# Patient Record
Sex: Female | Born: 1995 | Race: White | Hispanic: No | Marital: Single | Smoking: Former smoker
Health system: Southern US, Community
[De-identification: ages and names within clinical notes are randomized; demographics above are authoritative.]

## PROBLEM LIST (undated history)

## (undated) DIAGNOSIS — O24419 Gestational diabetes mellitus in pregnancy, unspecified control: Secondary | ICD-10-CM

## (undated) DIAGNOSIS — N83201 Unspecified ovarian cyst, right side: Secondary | ICD-10-CM

## (undated) DIAGNOSIS — F32A Depression, unspecified: Secondary | ICD-10-CM

## (undated) DIAGNOSIS — N83202 Unspecified ovarian cyst, left side: Secondary | ICD-10-CM

## (undated) DIAGNOSIS — F419 Anxiety disorder, unspecified: Secondary | ICD-10-CM

## (undated) DIAGNOSIS — F329 Major depressive disorder, single episode, unspecified: Secondary | ICD-10-CM

## (undated) HISTORY — PX: COMBINED HYSTEROSCOPY DIAGNOSTIC / D&C: SUR297

## (undated) HISTORY — DX: Anxiety disorder, unspecified: F41.9

## (undated) HISTORY — DX: Gestational diabetes mellitus in pregnancy, unspecified control: O24.419

## (undated) HISTORY — DX: Depression, unspecified: F32.A

## (undated) HISTORY — DX: Unspecified ovarian cyst, left side: N83.202

## (undated) HISTORY — PX: DILATION AND CURETTAGE OF UTERUS: SHX78

## (undated) HISTORY — DX: Unspecified ovarian cyst, right side: N83.201

## (undated) HISTORY — PX: APPENDECTOMY: SHX54

## (undated) HISTORY — DX: Major depressive disorder, single episode, unspecified: F32.9

---
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
## 2014-08-05 ENCOUNTER — Emergency Department: Payer: Self-pay | Admitting: Emergency Medicine

## 2014-11-11 ENCOUNTER — Ambulatory Visit
Admit: 2014-11-11 | Disposition: A | Discharge status: Home / Self Care | Payer: Self-pay | Attending: Obstetrics and Gynecology | Admitting: Obstetrics and Gynecology

## 2014-11-15 NOTE — H&P (Signed)
PATIENT NAME:  Holly Cain, Holly Cain MR#:  [id number] DATE OF BIRTH:  07/17/1996  DATE OF ADMISSION:  11/02/2013  REFERRING PHYSICIAN:  Dr. Sharyn Creamer.   PRIMARY CARE PHYSICIAN:  [facility].   CHIEF COMPLAINT:  Shortness of breath, cough, atypical chest pain.   HISTORY OF PRESENT ILLNESS:  This is an 19 year old female with significant past medical history of polycystic ovarian disease, presents with complaints of cough, shortness of breath and productive sputum, as well developed recently atypical chest pain related to cough, the patient reports these symptoms have been going on for almost a week right now, which prompted her to come to the ED, the patient was found to be tachycardic with low grade temp at 99, significant leukocytosis, the patient's d-dimer was mildly elevated, so she had CT chest angiogram which was negative for PE, but it did show diffuse infiltrative lung disease, the patient received one liter fluid boluses, received by mouth levofloxacin, but despite that remained tachycardic so hospitalists were requested to admit the patient, case was discussed with pulmonary on call by ED physician, the patient denies any hemoptysis, any recent travel, any leg swelling or edema.   PAST MEDICAL HISTORY:  Polycystic ovarian disease with cyst rupture in the past.   PAST SURGICAL HISTORY:  Appendectomy.   SOCIAL HISTORY:  The patient smokes cigarettes, 1 pack lasts her for four days, as well smokes vapor electronic cigarettes.  No alcohol.  No illicit drug use.   FAMILY HISTORY:  There is no family history of coronary artery disease at a young age.  No history of DVT or PE in the family.   ALLERGIES:  No known drug allergies.   HOME MEDICATIONS:  None.   REVIEW OF SYSTEMS: CONSTITUTIONAL:  The patient denies any fever, any chills.  Complains of fatigue and weakness.  Denies weight gain, weight loss.  EYES:  Denies blurred vision, double vision, inflammation, glaucoma.  EARS,  NOSE, THROAT:  Denies tinnitus, ear pain, hearing loss, epistaxis or discharge.  RESPIRATORY:  Complains of cough, productive sputum.  Denies any hemoptysis, any COPD.  CARDIOVASCULAR:  Reports atypical chest pain related to cough.  No arrhythmia.  No palpitation.  No syncope.  GASTROINTESTINAL:  Denies nausea, vomiting, diarrhea, abdominal pain, hematemesis.  GENITOURINARY:  Denies dysuria, hematuria, renal colic.  ENDOCRINE:  Denies polyuria, polydipsia, heat or cold intolerance.  HEMATOLOGY:  Denies anemia, easy bruising, bleeding diathesis.  INTEGUMENT:  Denies acne, rash or skin lesions.  MUSCULOSKELETAL:  Denies any arthritis, gout, back pain or redness.  NEUROLOGIC:  Denies CVA, TIA, ataxia, vertigo, tremors.  PSYCHIATRIC:  Denies anxiety, insomnia or bipolar disorder.   PHYSICAL EXAMINATION: VITAL SIGNS:  Temperature 98.8, T-max 99, pulse 117, respiratory rate 24, blood pressure 128/78, saturating 97% on room air.  GENERAL:  Well-nourished female who looks comfortable in bed, in no apparent distress.  HEENT:  Head atraumatic, normocephalic.  Pupils equal, reactive to light.  Pink conjunctivae.  Anicteric sclerae.  Dry oral mucosa.  NECK:  Supple.  No thyromegaly.  No JVD.  CHEST:  Good air entry bilaterally.  No wheezing, rales, rhonchi.  CARDIOVASCULAR:  S1, S2 heard.  No rubs, murmurs or gallops.  ABDOMEN:  Soft, nontender, nondistended.  Bowel sounds present.  EXTREMITIES:  No edema.  No clubbing.  No cyanosis.  Pedal and radial pulses +2 bilaterally.  PSYCHIATRIC:  Appropriate affect.  Awake, alert x 3.  Intact judgment and insight.  NEUROLOGIC:  Cranial nerves grossly intact.  Motor five out  of five.  No focal deficits.  MUSCULOSKELETAL:  No joint effusion or erythema.   PERTINENT LABORATORY DATA:  Glucose 86, BUN 7, creatinine 0.82, sodium 135, potassium 3.7, chloride 103, CO2 less than 0.02.  White blood cells 15.8, hemoglobin 13.6, hematocrit 42.5, platelets 257.  D-dimer  1792.   IMAGING STUDIES:  CT chest angiogram, no evidence of PE, showing diffuse infiltrative abnormality throughout both lungs.   ASSESSMENT AND PLAN: 1.  Sepsis, the patient meets sepsis criteria as she is tachycardic and has leukocytosis, this is most likely related to her pneumonia, will be started on broad-spectrum antibiotic.  2.  Pneumonia.  The patient appears to be having atypical pneumonia, will be started on Rocephin and Zithromax, we will consult pulmonary regarding her CT findings.  3.  Chest pain, atypical, noncardiac, related to cough, has no EKG changes.  We will cycle cardiac enzymes.  4.  Deep vein thrombosis prophylaxis.  SubQ heparin.  5.  CODE STATUS:  FULL CODE.  Total time spent on admission and patient care 50 minutes.     ____________________________ Starleen Armsawood S. Beren Yniguez, MD dse:ea D: 11/02/2013 00:56:50 ET T: 11/02/2013 02:33:42 ET JOB#: [id number]  cc: Starleen Armsawood S. Kayce Betty, MD, <Dictator> Buel Molder Teena IraniS Jossette Zirbel MD ELECTRONICALLY SIGNED 11/03/2013 2:46

## 2014-11-15 NOTE — Discharge Summary (Signed)
PATIENT NAME:  Holly DoeMCKINNEY, Holly M MR#:  [id number] DATE OF BIRTH:  [birth year]  DATE OF ADMISSION:  11/02/2013 DATE OF DISCHARGE:  11/05/2013  ADMITTING DIAGNOSIS: Shortness of breath, cough, chest pain.   DISCHARGE DIAGNOSES:  1.  Shortness of breath due to sepsis due to likely atypical pneumonia.  2.  Bilateral pulmonary abnormalities noted on CT scan. Differential diagnosis is broad including possible nonspecific interstitial pneumonia, idiopathic interstitial pneumonia,  interstitial pneumonia is also a consideration. Other possibilities include sarcoidosis.  3.  Tachycardia due to acute pulmonary infection, now improved.  4.  Leukocytosis due to pneumonia, now improved.  5.  Chest pain, pleuritic in nature, due to acute pulmonary process as described above.  6.  Polycystic ovarian disease with cyst rupture in the past.  7.  Status post appendectomy.   PERTINENT LABS AND EVALUATIONS: Admitting glucose 86, BUN 7, creatinine 0.82, sodium was 135, potassium 3.7, chloride 103. WBC count 15.8, hemoglobin 13.6, platelet count was 327. D-dimer 1792. CT scan of the chest per PE protocol showed no evidence of PE showing diffuse infiltrative abnormality throughout both lungs. There was significant subpleural component seen in the inferior right middle lobe and the right lower lobe as well as the inferior lingula of the left lower lobe. Admitting glucose 86, BUN 7, creatinine 0.82, sodium 135, potassium 3.7, chloride 103, CO2 is 26, calcium 9.4. Troponin less than 0.02 x 3. WBC 15.8, hemoglobin was 13.6, platelet count 257. Sed rate was 21. WBC on discharge was 8.6. Angiotensin enzymes were normal at 25. ANCA panel was negative. ANA comprehensive panel was negative. Rheumatoid arthritis factor slightly abnormal at 15.9. Echocardiogram of the heart showed normal EF, mild tricuspid regurg. Chest x-ray on April 13 showed minimal left basilar opacities noted concerning for pneumonia or edema with minimal  associated left pleural effusion.   HOSPITAL COURSE: Please refer to H and P done by the admitting physician. The patient is a 19 year old pleasant Caucasian female, who has had ongoing cough for the past few weeks, who presented with complaint of chest pain with deep breathing as well as shortness of breath. She was in the ED and was noted to have leukocytosis, temperature 99 and was tachycardic. Due to these symptoms, we were asked to admit the patient. She underwent a CT per PE protocol in the ED, which showed significant abnormalities in her lung parenchyma. There was consideration for possible atypical pneumonia. Also there were other differentials that were considered. Please refer to CT scan reports for further differential seen above in discharge diagnoses. The patient was seen by Dr. Belia HemanKasa of pulmonary, who recommended continuing antibiotics and a repeat CT scan in 4 to 6 weeks. The patient was kept on treatment for atypical pneumonia with azithromycin and ceftriaxone. With these treatments, her breathing and her chest pain improved. Her tachycardia also resolved. Her leukocytosis resolved. At this point, she is doing much better. Her breathing is improved. Her pain is improved. Her leukocytosis resolved; however, there is concern for some sort of underlying lung process; therefore, she will be having a repeat CT scan in 4 weeks and an outpatient followup by pulmonary for any further evaluation. At this time, she is stable for discharge.   DISCHARGE MEDICATIONS: Ibuprofen 400 q.6 hours p.r.n. for pleuritic chest pain, Tylenol 650 q.4 hours p.r.n. for pain or temperature greater then 100.4, albuterol 2 puffs 4 times a day as needed, guaifenesin 600, 1 tab p.o. b.i.d., azithromycin 500 mg 1 tab p.o. daily x 10 days  and Ceftin 500, 1 tab p.o. b.i.d. x 10 days.   DIET: Regular.   ACTIVITY: As tolerated.   FOLLOWUP: With [facility] for atypical CT scan changes. The patient to have a CT scan of the  chest in 4 weeks with contrast.   TIME SPENT: 35 minutes on this patient's discharge.  ____________________________ Lacie Scotts. Allena Katz, MD shp:aw D: 11/06/2013 08:15:40 ET T: 11/06/2013 08:40:36 ET JOB#: [id number]  cc: Claretta Kendra H. Allena Katz, MD, <Dictator> Charise Carwin MD ELECTRONICALLY SIGNED 11/08/2013 13:45

## 2014-11-17 LAB — SURGICAL PATHOLOGY

## 2014-11-23 NOTE — Op Note (Signed)
PATIENT NAME:  Holly Cain, Holly Cain MR#:  [id number] DATE OF BIRTH:  [birth year]  DATE OF PROCEDURE:  11/11/2014  PREOPERATIVE DIAGNOSIS: Embryonic demise at 7 weeks.   POSTOPERATIVE DIAGNOSIS: Embryonic demise at 7 weeks.  PROCEDURE: Suction dilation and curettage.   SURGEON: [place] Bingharlie Remi Rester, MD  ASSISTANT: None.   ANESTHESIA: MAC and paracervical block.   INTRAVENOUS FLUIDS: Approximately 500 mL of crystalloid.   ESTIMATED BLOOD LOSS: 25 mL.   URINE OUTPUT: No in and out catheterization was done.   ANTIBIOTICS: Doxycycline 100 mg IV x1 given preoperatively.   VTE PROPHYLAXIS: None indicated.  COMPLICATIONS: None.   SPECIMENS: POC to pathology and then funeral home.  DISPOSITION: Stable to PACU.   FINDINGS: Exam under anesthesia revealed small anteverted mobile uterus and no adnexal masses and normal EGBUS and cervix on speculum exam. Normal-appearing products of conception were noted on suction D and C.   DESCRIPTION OF PROCEDURE: The patient was taken to the operating room where anesthesia was administered. She was then prepped and draped in normal sterile fashion in dorsal lithotomy position in [place] stirrups. Next, a speculum was then inserted and the anterior lip of the cervix grasped with a single-tooth tenaculum and a paracervical block done with 20 mL of 1% lidocaine. Next, the cervix was tested and was already dilated enough to fit a #8 suction curette. The suction was then tested and noted to be at 60 mmHg and an uncomplicated suction D and C was then performed. A gentle manual curettage was done in all 4 quadrants with gritty texture noted and 1 more suction pass was then done yielding scanty material and blood products. All instruments were removed from the patient's vagina and excellent hemostasis was noted. The patient was taken to the recovery room awake, alert, and breathing independently in stable condition.  ____________________________ [place] Bingharlie Karyn Brull,  MD cp:sb D: 11/11/2014 10:06:25 ET T: 11/11/2014 11:31:01 ET JOB#: [id number]  cc: [place] Bingharlie Cailey Trigueros, MD, <Dictator> [place] BingHARLIE Kory Panjwani MD ELECTRONICALLY SIGNED 11/13/2014 7:50

## 2015-06-15 IMAGING — CT CT ANGIO CHEST
2 of 6 series · 18 of 36 positions shown · IV contrast (APPLIED)
Comparison: DG CHEST 1V dated 12/24/2009

CLINICAL DATA: Sudden onset shortness of breath with heaviness in
chest while at work last night, increased smoking recently

EXAM:
CT ANGIOGRAPHY CHEST WITH CONTRAST
TECHNIQUE: Multidetector CT imaging of the chest was performed using the
standard protocol during bolus administration of intravenous
contrast. Multiplanar CT image reconstructions and MIPs were
obtained to evaluate the vascular anatomy.
CONTRAST:  71 mL Isovue 370

[Series 5: pe 1.0 thins · axial · 0.62mm/px · z∈[-578,-380]mm · 17 of 224 slices shown]
[im 13/224  lung]
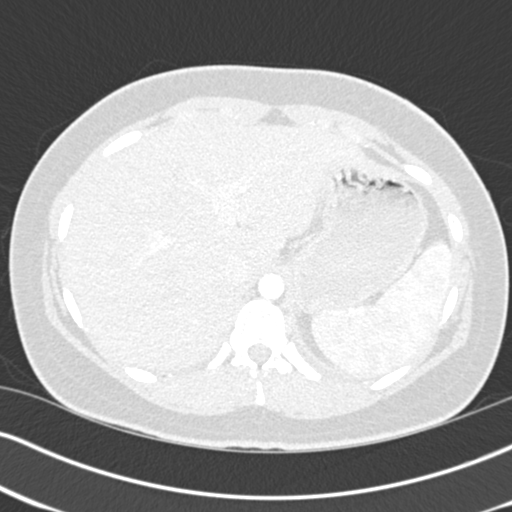
[im 25/224  mediastinal]
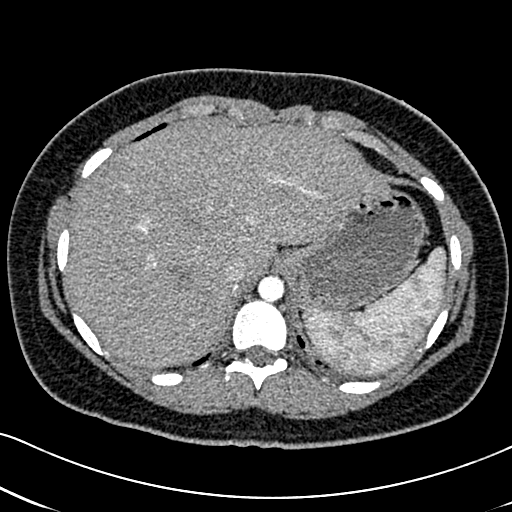
[im 38/224  lung]
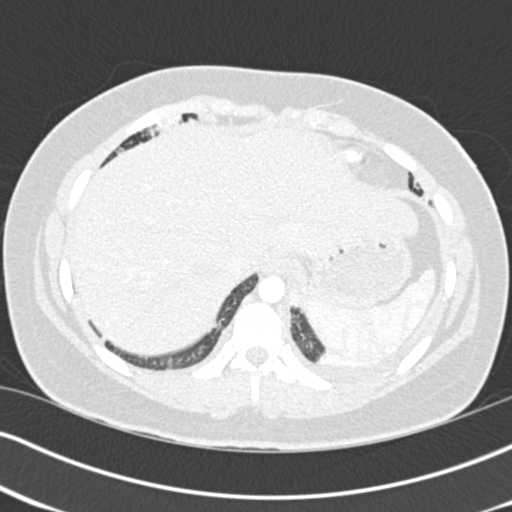
[im 50/224  mediastinal]
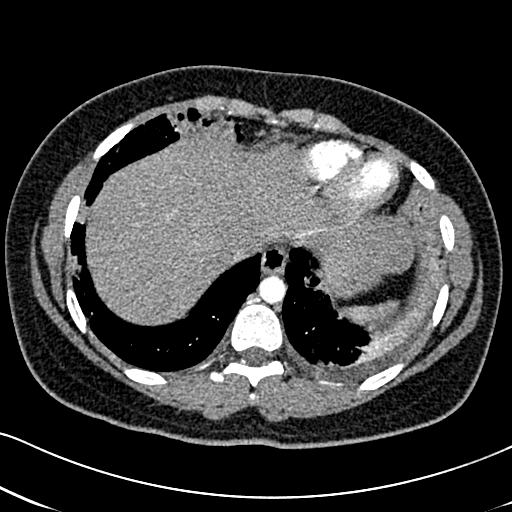
[im 62/224  lung]
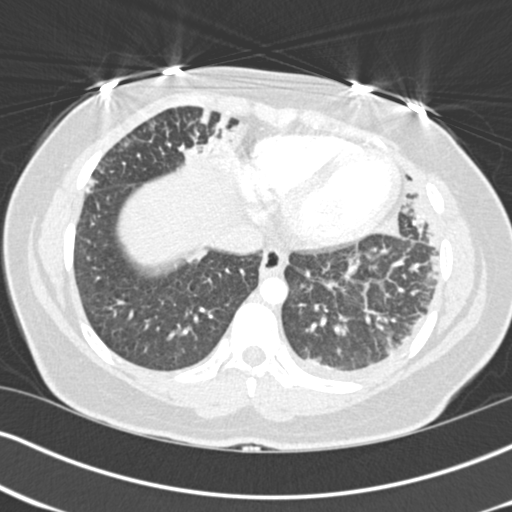
[im 75/224  mediastinal]
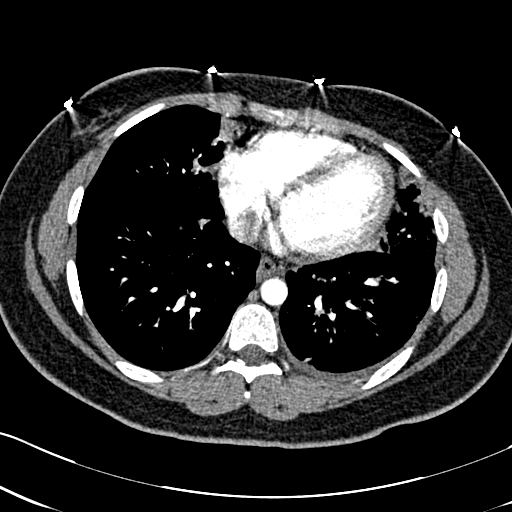
[im 87/224  lung]
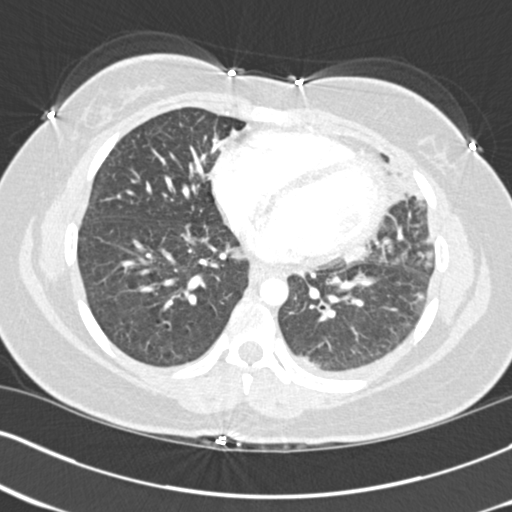
[im 100/224  mediastinal]
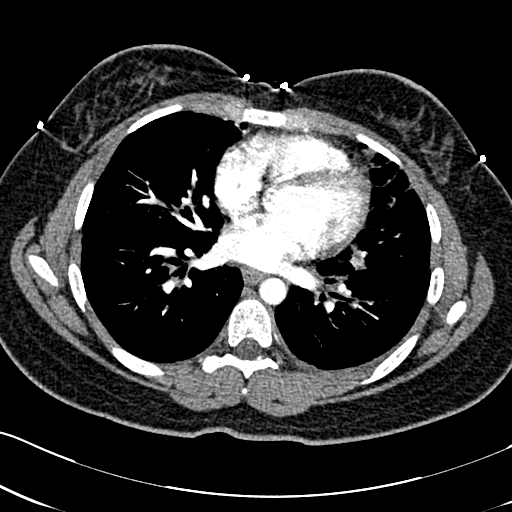
[im 112/224  lung]
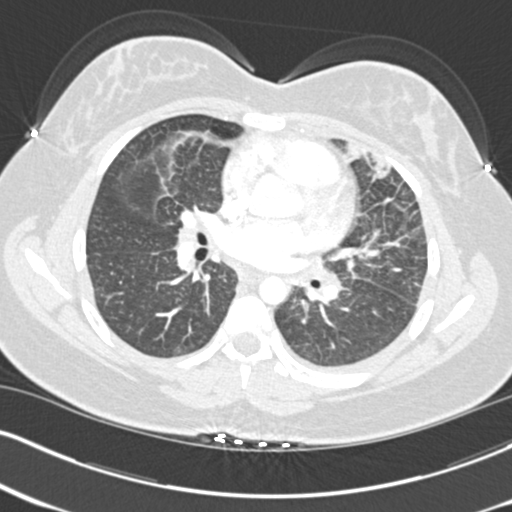
[im 124/224  mediastinal]
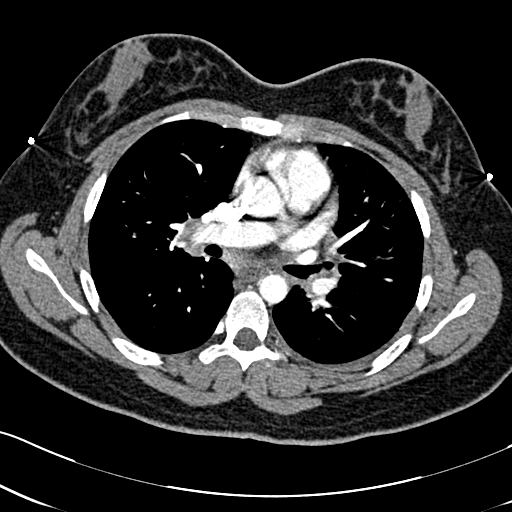
[im 137/224  lung]
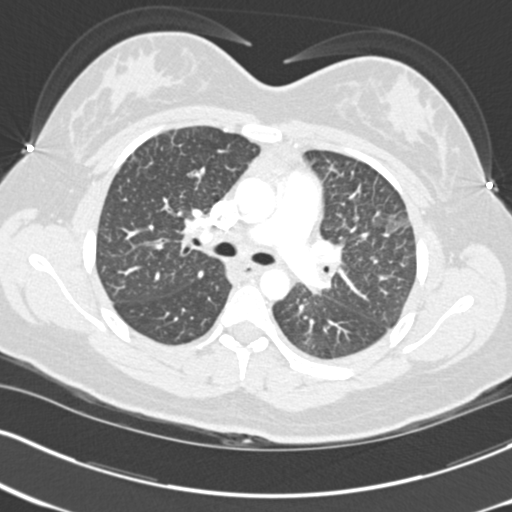
[im 149/224  mediastinal]
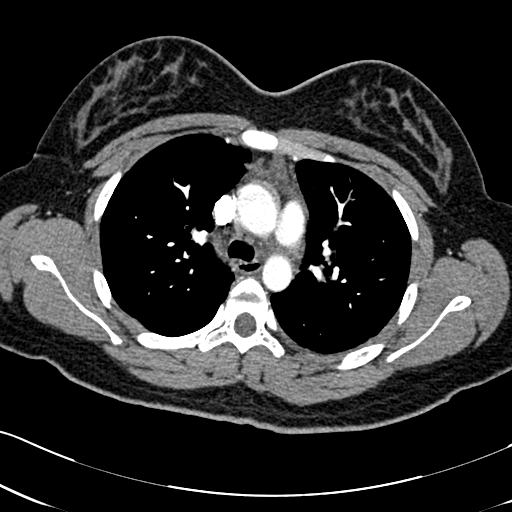
[im 162/224  lung]
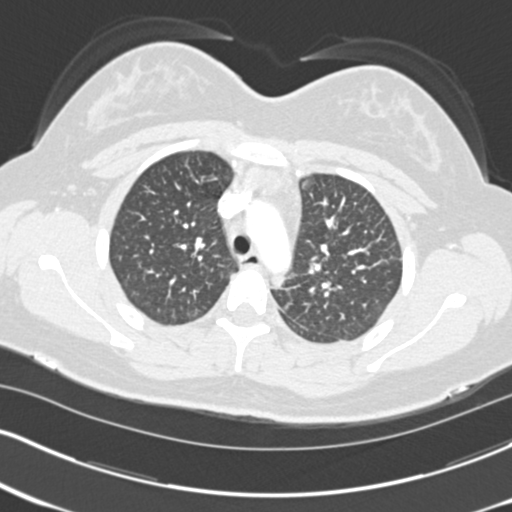
[im 174/224  mediastinal]
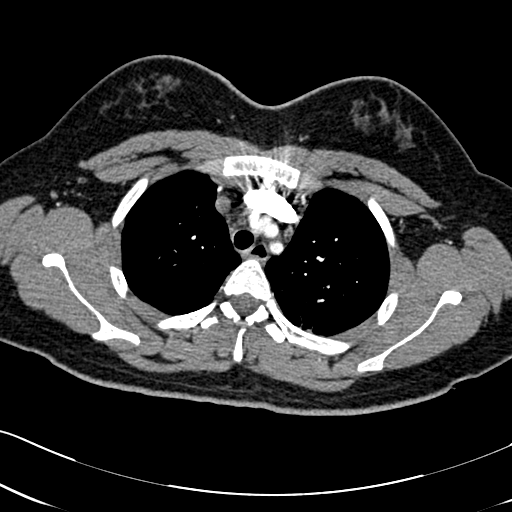
[im 186/224  lung]
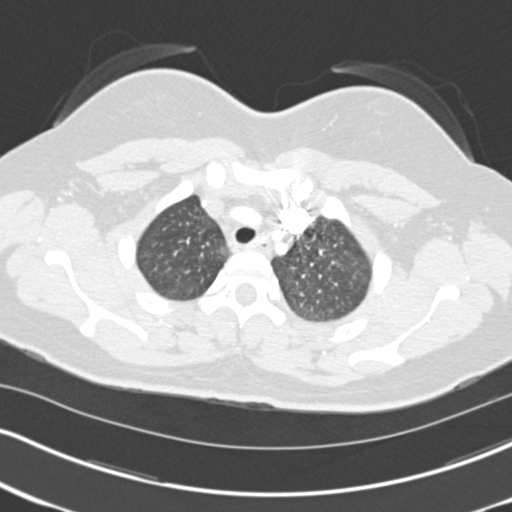
[im 199/224  mediastinal]
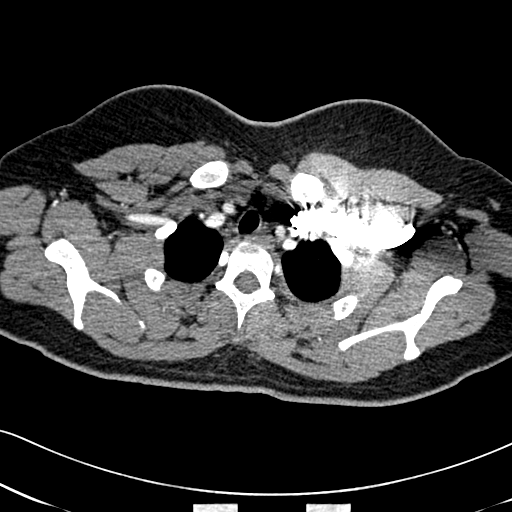
[im 211/224  lung]
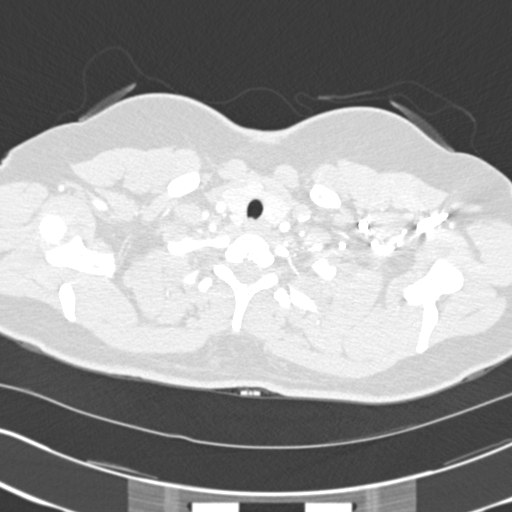

[Series 7: cor pe 2.0 mpr · coronal · 0.54mm/px · 1 of 93 slices shown]
[im 47/93  mediastinal]
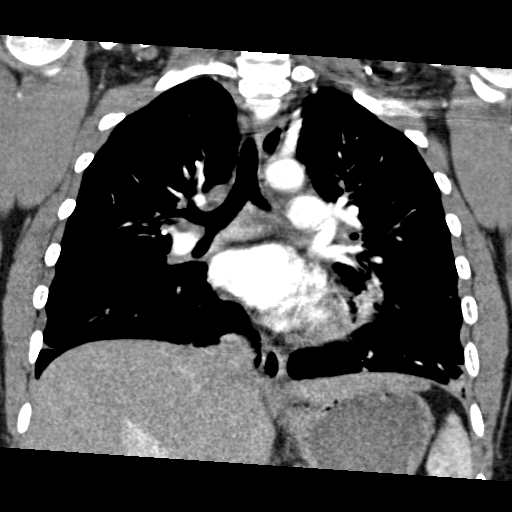

[18 of 36 positions shown; findings below may reference images not displayed]

FINDINGS: The thoracic aorta shows no dissection or dilatation. There are no
filling defects in the pulmonary arterial system to suggest embolus.
There is soft tissue in the anterior mediastinum measuring
approximately 22 x 18 mm which may represent residual thymic tissue.
There are small mediastinal lymph nodes. There is a right hilar
lymph node measuring approximately 13 mm.

There is no pericardial effusion. There is a very small left pleural
effusion measuring about 9 mm in diameter. There is no pleural
effusion on the right.

Scans through the upper abdomen are negative. There are no acute
musculoskeletal findings.

There is a diffuse a micronodular and reticular infiltrate of
pattern throughout both lungs. There is no evidence of apical
basilar gradient. There is a significant subpleural component, seen
in the inferior right middle lobe and in the right lower lobe, as
well as in the inferior lingula and in the left lower lobe. In these
areas of subpleural involvement there is a more confluent irregular
patchy parenchymal consolidation.

Review of the MIP images confirms the above findings.
IMPRESSION: 1. No evidence of pulmonary arterial embolism.
2. Diffuse infiltrative abnormality throughout both lungs. The
findings are concerning for the possibility of an idiopathic
interstitial pneumonia. Nonspecific interstitial pneumonia and usual
interstitial pneumonia are both considerations. Given the patient's
age and smoking history, the possibility of desquamative
interstitial pneumonia is also considered. Other causes such as
infectious etiologies appear less likely, although miliary disease
such as tuberculosis could be considered. Sarcoidosis is another
consideration, although less likely. Pulmonological consultation is
suggested.

## 2015-08-16 IMAGING — CR RIGHT MIDDLE FINGER 2+V
1 series · 3 of 3 positions shown · non-contrast
Comparison: None.

CLINICAL DATA: Embroidery needle rupture, query foreign body.

EXAM:
RIGHT MIDDLE FINGER 2+V

[Series 1: pa · 0.17mm/px · 3 of 3 slices shown]
[im 1/3]
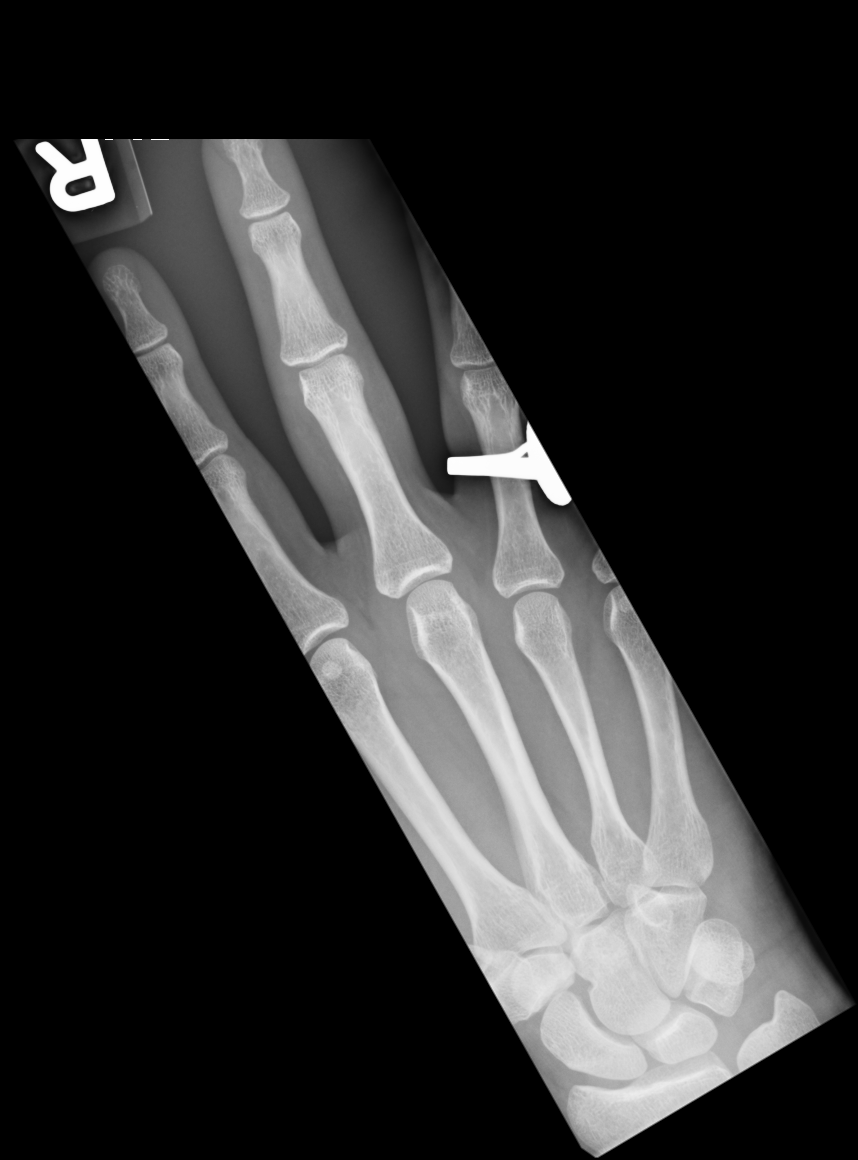
[im 2/3]
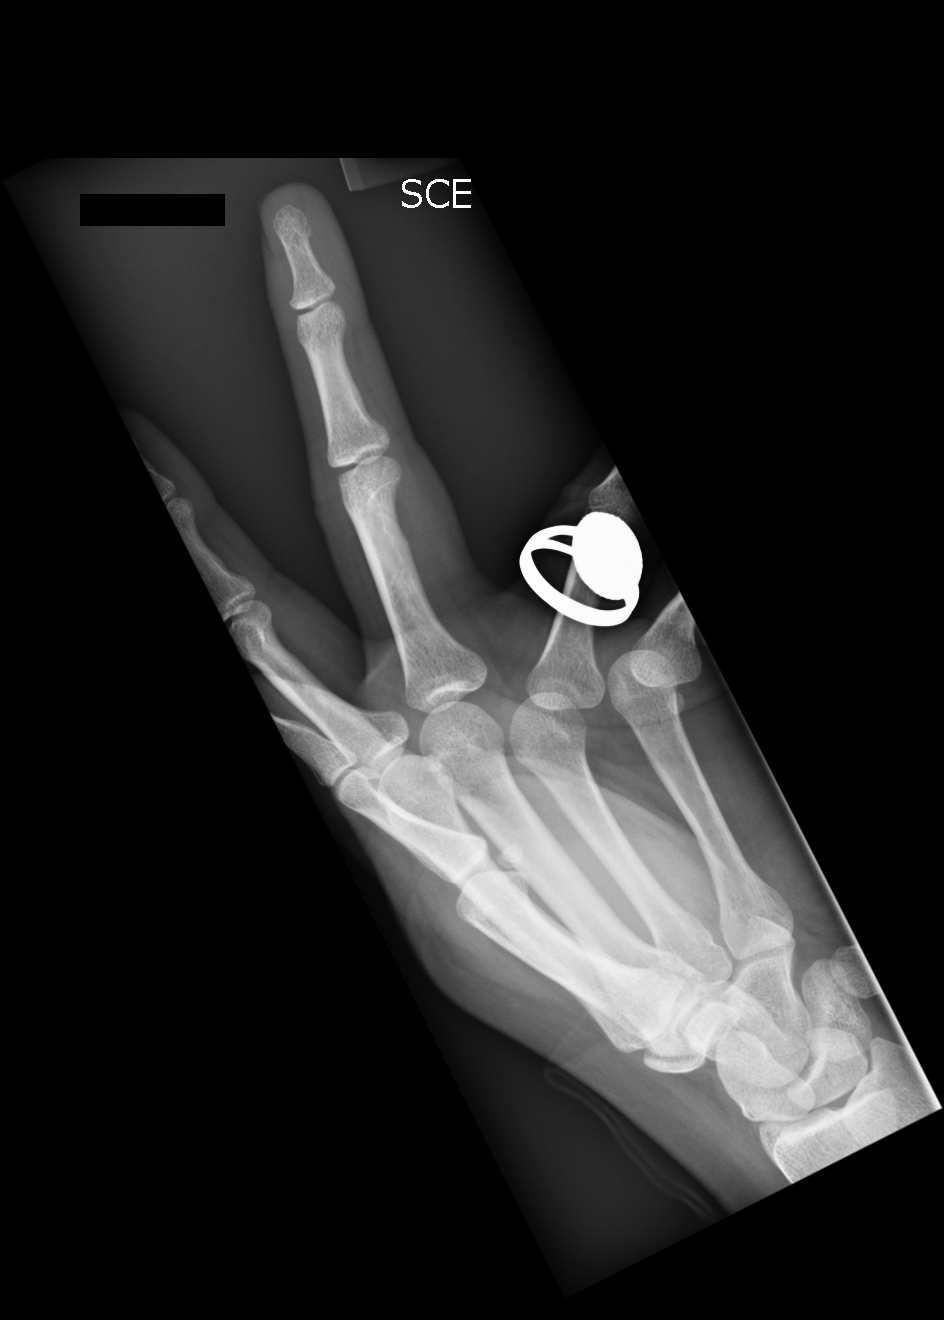
[im 3/3]
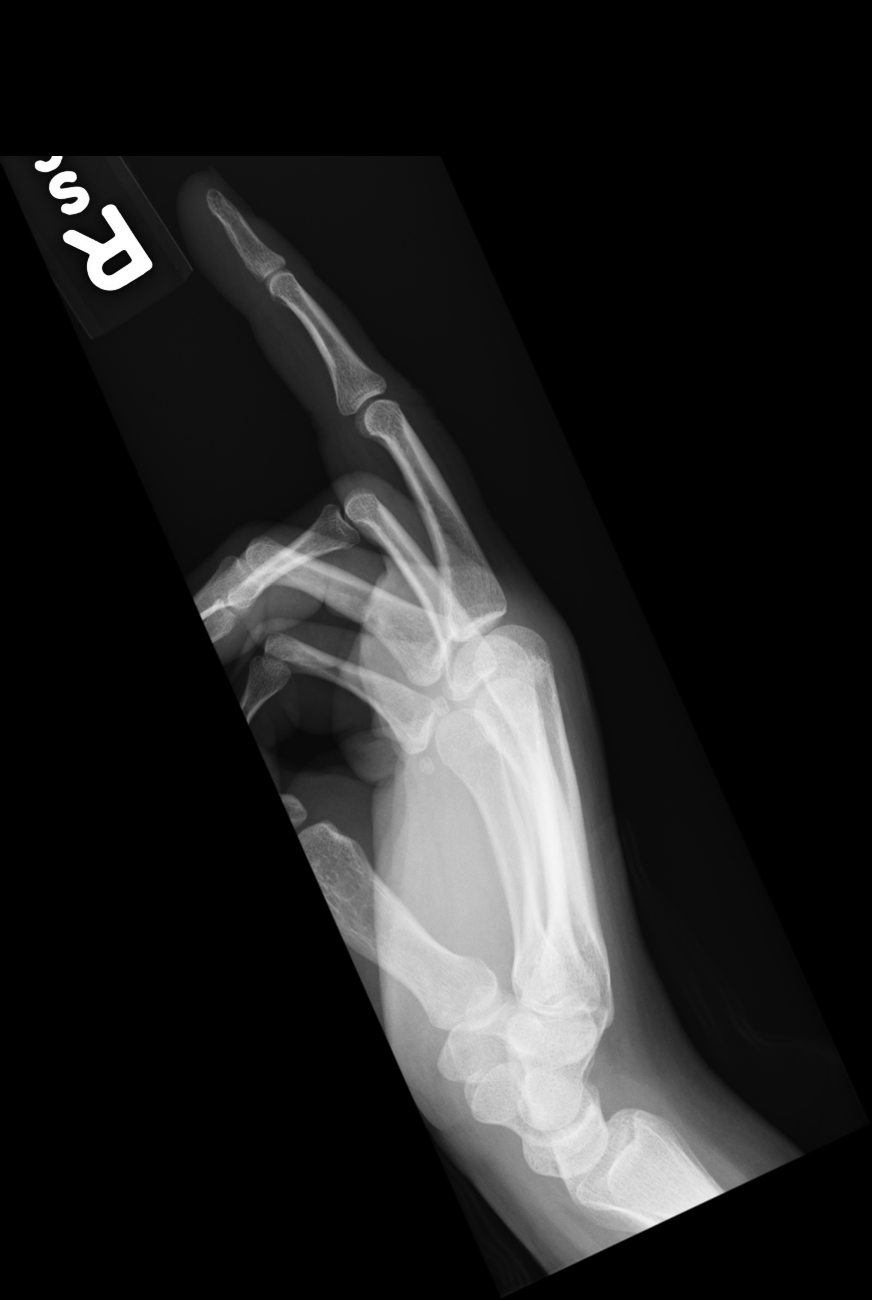

[3 of 3 positions shown; findings below may reference images not displayed]

FINDINGS: No metal foreign body or gas within the middle finger. No
significant bony abnormality observed.
IMPRESSION: 1. No foreign body or bony abnormality of the middle finger.

## 2016-02-27 ENCOUNTER — Emergency Department
Admission: EM | Admit: 2016-02-27 | Discharge: 2016-02-27 | Disposition: A | Discharge status: Home / Self Care | Payer: BLUE CROSS/BLUE SHIELD | Attending: Emergency Medicine | Admitting: Emergency Medicine

## 2016-02-27 ENCOUNTER — Encounter: Payer: Self-pay | Admitting: Emergency Medicine

## 2016-02-27 DIAGNOSIS — F17200 Nicotine dependence, unspecified, uncomplicated: Secondary | ICD-10-CM | POA: Diagnosis not present

## 2016-02-27 DIAGNOSIS — R55 Syncope and collapse: Secondary | ICD-10-CM

## 2016-02-27 DIAGNOSIS — L03119 Cellulitis of unspecified part of limb: Secondary | ICD-10-CM | POA: Insufficient documentation

## 2016-02-27 DIAGNOSIS — L237 Allergic contact dermatitis due to plants, except food: Secondary | ICD-10-CM | POA: Diagnosis not present

## 2016-02-27 LAB — CBC
HCT: 40.3 % (ref 35.0–47.0)
HEMOGLOBIN: 14.3 g/dL (ref 12.0–16.0)
MCH: 29.4 pg (ref 26.0–34.0)
MCHC: 35.4 g/dL (ref 32.0–36.0)
MCV: 83 fL (ref 80.0–100.0)
Platelets: 293 10*3/uL (ref 150–440)
RBC: 4.86 MIL/uL (ref 3.80–5.20)
RDW: 14 % (ref 11.5–14.5)
WBC: 21.8 10*3/uL — ABNORMAL HIGH (ref 3.6–11.0)

## 2016-02-27 LAB — TROPONIN I: Troponin I: <0.03 ng/mL (ref <0.03)

## 2016-02-27 LAB — URINALYSIS COMPLETE WITH MICROSCOPIC (ARMC ONLY)
Bilirubin Urine: NEGATIVE
Glucose, UA: NEGATIVE mg/dL
KETONES UR: NEGATIVE mg/dL
NITRITE: NEGATIVE
PH: 6 (ref 5.0–8.0)
PROTEIN: NEGATIVE mg/dL
SPECIFIC GRAVITY, URINE: 1.026 (ref 1.005–1.030)

## 2016-02-27 LAB — BASIC METABOLIC PANEL
ANION GAP: 12 (ref 5–15)
BUN: 15 mg/dL (ref 6–20)
CALCIUM: 9.9 mg/dL (ref 8.9–10.3)
CO2: 23 mmol/L (ref 22–32)
Chloride: 103 mmol/L (ref 101–111)
Creatinine, Ser: 0.81 mg/dL (ref 0.44–1.00)
Glucose, Bld: 147 mg/dL — ABNORMAL HIGH (ref 65–99)
Potassium: 4 mmol/L (ref 3.5–5.1)
Sodium: 138 mmol/L (ref 135–145)

## 2016-02-27 LAB — GLUCOSE, CAPILLARY: Glucose-Capillary: 155 mg/dL — ABNORMAL HIGH (ref 65–99)

## 2016-02-27 LAB — POCT PREGNANCY, URINE: Preg Test, Ur: NEGATIVE

## 2016-02-27 MED ORDER — CEPHALEXIN 500 MG PO CAPS: 500.0000 mg | ORAL_CAPSULE | Freq: Three times a day (TID) | ORAL | 0 refills | Status: DC

## 2016-02-27 MED ORDER — CEFTRIAXONE SODIUM 1 G IJ SOLR
1.0000 g | INTRAMUSCULAR | Status: DC
  Administered 2016-02-27: 1 g via INTRAVENOUS
  Filled 2016-02-27: qty 10

## 2016-02-27 MED ORDER — METHYLPREDNISOLONE 4 MG PO TBPK: ORAL_TABLET | ORAL | 0 refills | Status: DC

## 2016-02-27 MED ORDER — SODIUM CHLORIDE 0.9 % IV BOLUS (SEPSIS)
1000.0000 mL | Freq: Once | INTRAVENOUS | Status: AC
  Administered 2016-02-27: 1000 mL via INTRAVENOUS

## 2016-02-27 NOTE — ED Notes (Signed)
Spoke with dr. Zenda Alpers regarding pt's chief complaint and right leg decreased sensation, no order for head ct received at this time.

## 2016-02-27 NOTE — ED Triage Notes (Signed)
Pt states was at work around 2330 when she felt like she was going to faint, began to feel weak and had an episode or emesis. Pt states now has decreased sensation to right lower extremity and tingling to left hand. Pt appears in no acute distress. Pt denies headahce, blurred vision.

## 2016-02-27 NOTE — Discharge Instructions (Signed)
1. Take antibiotic as prescribed (Keflex 500 mg 3 times daily 7 days). 2. Take steroid taper as prescribed (Medrol Dosepak). 3. Return to the ER for worsening symptoms, persistent vomiting, difficulty breathing or other concerns.

## 2016-02-27 NOTE — ED Notes (Signed)
Urged patient to void. 

## 2016-02-27 NOTE — ED Provider Notes (Signed)
[facility] Emergency Department Provider Note   ____________________________________________   First MD Initiated Contact with Patient 02/27/16 0319     (approximate)  I have reviewed the triage vital signs and the nursing notes.   HISTORY  Chief Complaint Near Syncope; Weakness; and Numbness    HPI Holly Cain is a 20 y.o. female who presents to the ED from work with a chief complaint of near syncope. 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 States she took 2 puffs, became nauseous and went inside. She felt like she was going to pass out so she quickly sat in a chair. She drank some Pepsi and vomited it up. Reports she experiences lower abdominal cramping "whenever I vomit liquids". States she had lower abdominal cramping as she was vomiting her Pepsi. She placed her left hand down on the table and states it felt like it was tingling. Tingling sensation resolved after she picked her hand back up. 2 weeks ago patient thinks she got into poison ivy or poison oak because she has been experiencingan itchy, blistering rash to her feet. Mother has been applying calamine lotion without much relief. Patient denies recent fever, chills, headache, neck pain, chest pain, shortness of breath, dysuria, diarrhea. Denies recent travel, trauma or OCPs.   Past Medical History None  There are no active problems to display for this patient.   Past Surgical History:  Procedure Laterality Date  . APPENDECTOMY      Prior to Admission medications   Medication Sig Start Date End Date Taking? Authorizing Provider  cephALEXin (KEFLEX) 500 MG capsule Take 1 capsule (500 mg total) by mouth 3 (three) times daily. 02/27/16   Irean Hong, MD  methylPREDNISolone (MEDROL DOSEPAK) 4 MG TBPK tablet Take as directed 02/27/16   Irean Hong, MD    Allergies Review of patient's allergies indicates no known allergies.  No family history on  file.  Social History Social History  Substance Use Topics  . Smoking status: Current Some Day Smoker  . Smokeless tobacco: Never Used  . Alcohol use No    Review of Systems  Constitutional: Positive for generalized weakness. No fever/chills. Eyes: No visual changes. ENT: No sore throat. Cardiovascular: Denies chest pain. Respiratory: Denies shortness of breath. Gastrointestinal: No abdominal pain.  Positive for one episode of vomiting.  No diarrhea.  No constipation. Genitourinary: Negative for dysuria. Musculoskeletal: Negative for back pain. Skin: Positive for rash. Neurological: Positive for near-syncope. Negative for headaches, focal weakness or numbness.  10-point ROS otherwise negative.  ____________________________________________   PHYSICAL EXAM:  VITAL SIGNS: ED Triage Vitals [02/27/16 0018]  Enc Vitals Group     BP 115/79     Pulse Rate (!) 112     Resp      Temp 98.3 F (36.8 C)     Temp Source Oral     SpO2 100 %     Weight 175 lb (79.4 kg)     Height 5' (1.524 m)     Head Circumference      Peak Flow      Pain Score      Pain Loc      Pain Edu?      Excl. in GC?     Constitutional: Alert and oriented. Well appearing and in no acute distress. Eyes: Conjunctivae are normal. PERRL. EOMI. Head: Atraumatic. Nose: No congestion/rhinnorhea. Mouth/Throat: Mucous membranes are moist.  Oropharynx non-erythematous.  Neck: No stridor.  No carotid bruits. Cardiovascular: Normal rate, regular rhythm. Grossly normal heart sounds.  Good peripheral circulation. Respiratory: Normal respiratory effort.  No retractions. Lungs CTAB. Gastrointestinal: Soft and nontender to light and deep palpation. No distention. No abdominal bruits. No CVA tenderness. Musculoskeletal: No lower extremity tenderness nor edema.  No joint effusions. Neurologic:  Normal speech and language. No gross focal neurologic deficits are appreciated. No gait instability. Skin:  Skin is warm,  dry and intact. Bilateral feet with crusted patchy areas of excoriation. Small areas with weeping. Some areas with warmth and erythema. No fluctuance. Right heel in particular has a linear line of weeping vesicles. Psychiatric: Mood and affect are normal. Speech and behavior are normal.  ____________________________________________   LABS (all labs ordered are listed, but only abnormal results are displayed)  Labs Reviewed  BASIC METABOLIC PANEL - Abnormal; Notable for the following:       Result Value   Glucose, Bld 147 (*)    All other components within normal limits  CBC - Abnormal; Notable for the following:    WBC 21.8 (*)    All other components within normal limits  URINALYSIS COMPLETEWITH MICROSCOPIC ([facility] ONLY) - Abnormal; Notable for the following:    Color, Urine YELLOW (*)    APPearance HAZY (*)    Hgb urine dipstick 2+ (*)    Leukocytes, UA TRACE (*)    Bacteria, UA RARE (*)    Squamous Epithelial / LPF 0-5 (*)    All other components within normal limits  GLUCOSE, CAPILLARY - Abnormal; Notable for the following:    Glucose-Capillary 155 (*)    All other components within normal limits  TROPONIN I  CBG MONITORING, ED  POC URINE PREG, ED  POCT PREGNANCY, URINE   ____________________________________________  EKG  ED ECG REPORT I, Shaylen Nephew J, the attending physician, personally viewed and interpreted this ECG.   Date: 02/27/2016  EKG Time: 0026  Rate: 112  Rhythm: sinus tachycardia  Axis: Normal  Intervals:none  ST&T Change: Nonspecific  ____________________________________________  RADIOLOGY  None ____________________________________________   PROCEDURES  Procedure(s) performed: None  Procedures  Critical Care performed: No  ____________________________________________   INITIAL IMPRESSION / ASSESSMENT AND PLAN / ED COURSE  Pertinent labs & imaging results that were available during my care of the patient were reviewed by me and  considered in my medical decision making (see chart for details).  20 year old otherwise healthy female who presents status post near syncope at work. She is well-appearing on my interview and exam. Laboratory and urinalysis results notable for leukocytosis. She has no fever, chest pain, cough or abdominal pain. Feel leukocytosis may be secondary to stress reaction as well as mild cellulitis on her feet secondary to poison ivy. Orthostatics within normal limits. Will give IV Rocephin while patient is in the emergency department, then discharge her on Keflex and Medrol Dosepak.  Clinical Course  Comment By Time  IV antibiotics completed. Patient is resting comfortably in no acute distress. No complaints of paresthesias. Will continue patient on Keflex for her cellulitis, Medrol Dosepak for poison ivy dermatitis and patient will follow-up with her PCP early next week. Strict return precautions given. Patient family verbalize understanding and agree with plan of care. Irean Hong, MD 08/05 [phone]     ____________________________________________   FINAL CLINICAL IMPRESSION(S) / ED DIAGNOSES  Final diagnoses:  Near syncope  Poison ivy dermatitis  Cellulitis of lower extremity, unspecified laterality      NEW MEDICATIONS STARTED  DURING THIS VISIT:  Discharge Medication List as of 02/27/2016  7:16 AM    START taking these medications   Details  cephALEXin (KEFLEX) 500 MG capsule Take 1 capsule (500 mg total) by mouth 3 (three) times daily., Starting Sat 02/27/2016, Print    methylPREDNISolone (MEDROL DOSEPAK) 4 MG TBPK tablet Take as directed, Print         Note:  This document was prepared using Dragon voice recognition software and may include unintentional dictation errors.    Irean Hong, MD 02/27/16 [phone]

## 2016-02-27 NOTE — ED Notes (Signed)
Pt reports to have blisters on both feet for a week and believes it is poison iv. She has been treating this at home. Pt states, that she forgot to mention this to the triage nurse during her assessment.

## 2016-05-11 ENCOUNTER — Ambulatory Visit: Payer: Self-pay | Admitting: Family Medicine

## 2016-05-23 ENCOUNTER — Ambulatory Visit: Payer: Self-pay | Admitting: Family Medicine

## 2016-10-04 ENCOUNTER — Ambulatory Visit: Payer: BLUE CROSS/BLUE SHIELD | Admitting: Obstetrics & Gynecology

## 2016-10-04 ENCOUNTER — Ambulatory Visit: Payer: BLUE CROSS/BLUE SHIELD | Admitting: Advanced Practice Midwife

## 2017-05-17 ENCOUNTER — Encounter: Payer: Self-pay | Admitting: Maternal Newborn

## 2017-05-17 ENCOUNTER — Ambulatory Visit: Payer: BLUE CROSS/BLUE SHIELD | Admitting: Maternal Newborn

## 2017-12-21 ENCOUNTER — Encounter: Payer: BLUE CROSS/BLUE SHIELD | Admitting: Advanced Practice Midwife

## 2018-02-05 ENCOUNTER — Encounter: Payer: Self-pay | Admitting: Certified Nurse Midwife

## 2018-02-05 ENCOUNTER — Ambulatory Visit (INDEPENDENT_AMBULATORY_CARE_PROVIDER_SITE_OTHER): Payer: Commercial Managed Care - PPO | Admitting: Certified Nurse Midwife

## 2018-02-05 VITALS — BP 125/80 | HR 89 | Wt 183.7 lb

## 2018-02-05 DIAGNOSIS — Z3482 Encounter for supervision of other normal pregnancy, second trimester: Secondary | ICD-10-CM

## 2018-02-05 LAB — POCT URINALYSIS DIPSTICK
Bilirubin, UA: NEGATIVE
Glucose, UA: NEGATIVE
KETONES UA: NEGATIVE
Leukocytes, UA: NEGATIVE
NITRITE UA: NEGATIVE
Odor: NEGATIVE
PH UA: 6 (ref 5.0–8.0)
PROTEIN UA: NEGATIVE
SPEC GRAV UA: 1.02 (ref 1.010–1.025)
Urobilinogen, UA: 0.2 E.U./dL

## 2018-02-05 NOTE — Patient Instructions (Signed)

## 2018-02-05 NOTE — Progress Notes (Signed)
[facility]

## 2018-02-05 NOTE — Progress Notes (Signed)
NEW OB HISTORY AND PHYSICAL  SUBJECTIVE:       Holly Cain is a 22 y.o. G2P0010 female, No LMP recorded. Patient is pregnant., Estimated Date of Delivery: 08/02/18, 5844w4d, presents today [facility]  [facility]. She has no unusual complaints.   Gynecologic History No LMP recorded. Patient is pregnant. Normal Contraception: none Last Pap: 10/11/2016. Results were: normal  Obstetric History OB History  Gravida Para Term Preterm AB Living  2       1 0  SAB TAB Ectopic Multiple Live Births  1            # Outcome Date GA Lbr Len/2nd Weight Sex Delivery Anes PTL Lv  2 Current           1 SAB 2016            Past Medical History:  Diagnosis Date  . Anxiety   . Depression     Past Surgical History:  Procedure Laterality Date  . APPENDECTOMY    . COMBINED HYSTEROSCOPY DIAGNOSTIC / D&C    . DILATION AND CURETTAGE OF UTERUS      Current Outpatient Medications on File Prior to Visit  Medication Sig Dispense Refill  . Prenatal Vit-Fe Fumarate-FA (PRENATAL 1+1 PO) Take by mouth.     No current facility-administered medications on file prior to visit.     No Known Allergies  Social History   Socioeconomic History  . Marital status: Single    Spouse name: Not on file  . Number of children: Not on file  . Years of education: Not on file  . Highest education level: Not on file  Occupational History  . Not on file  Social Needs  . Financial resource strain: Not on file  . Food insecurity:    Worry: Not on file    Inability: Not on file  . Transportation needs:    Medical: Not on file    Non-medical: Not on file  Tobacco Use  . Smoking status: Former Smoker    Last attempt to quit: 07/25/2017    Years since quitting: 0.5  . Smokeless tobacco: Former Engineer, waterUser  Substance and Sexual Activity  . Alcohol use: No  . Drug use: No  . Sexual activity: Yes    Birth control/protection: None  Lifestyle  . Physical activity:    Days per week: Not  on file    Minutes per session: Not on file  . Stress: Not on file  Relationships  . Social connections:    Talks on phone: Not on file    Gets together: Not on file    Attends religious service: Not on file    Active member of club or organization: Not on file    Attends meetings of clubs or organizations: Not on file    Relationship status: Not on file  . Intimate partner violence:    Fear of current or ex partner: Not on file    Emotionally abused: Not on file    Physically abused: Not on file    Forced sexual activity: Not on file  Other Topics Concern  . Not on file  Social History Narrative  . Not on file    Family History  Problem Relation Age of Onset  . Leukemia Maternal Grandfather   . Diabetes Father   . Breast cancer Paternal Aunt   . Ovarian cancer Neg Hx   . Colon cancer Neg Hx   .  Heart disease Neg Hx     The following portions of the patient's history were reviewed and updated as appropriate: allergies, current medications, past OB history, past medical history, past surgical history, past family history, past social history, and problem list.  She denies smoking, stating that she used to, then vaped and now she does neither. She denies drugs and alcohol. She does not exercise.   OBJECTIVE: Initial Physical Exam (New OB)  GENERAL APPEARANCE: alert, well appearing, in no apparent distress, overweight HEAD: normocephalic, atraumatic MOUTH: mucous membranes moist, pharynx normal without lesions THYROID: no thyromegaly or masses present BREASTS:  LUNGS: clear to auscultation, no wheezes, rales or rhonchi, symmetric air entry HEART: regular rate and rhythm, no murmurs ABDOMEN: soft, nontender, nondistended, obese, no abnormal masses, no epigastric pain EXTREMITIES: no redness or tenderness in the calves or thighs, no edema, no limitation in range of motion SKIN: normal coloration and turgor, no rashes LYMPH NODES: no adenopathy palpable NEUROLOGIC: alert,  oriented, normal speech, no focal findings or movement disorder noted  PELVIC EXAM pt declines   ASSESSMENT: Normal pregnancy  PLAN: New OB counseling: The patient has been given an overview regarding routine prenatal care. Recommendations regarding diet, weight gain, and exercise in pregnancy were given. Prenatal testing, optional genetic testing, and ultrasound use in pregnancy were reviewed. Pt states she had 1st trimester screen with out u/s at [facility]. Discussed second trimester screening, cell free DNA, and Carrier screening. Pamphlets given. Benefits of Breast Feeding were discussed. The patient is encouraged to consider nursing her baby post partum.  Doreene Burke, CNM

## 2018-02-12 ENCOUNTER — Encounter: Payer: BLUE CROSS/BLUE SHIELD | Admitting: Maternal Newborn

## 2018-02-12 ENCOUNTER — Encounter: Payer: Commercial Managed Care - PPO | Admitting: Maternal Newborn

## 2018-03-02 ENCOUNTER — Telehealth: Payer: Self-pay | Admitting: Certified Nurse Midwife

## 2018-03-02 NOTE — Telephone Encounter (Signed)
Patient called stating she leaked some fluid; she is 18 weeks 1 day.  No contractions, no bleeding, no nausea/vomiting and no headaches or dizziness.  Some slight pain on her right side, but no other symptoms at this time.  This episode was isolated and happened 20 minutes ago.  Patient stated she was lying on the bed, went to get up and leaked the fluid.  Smelled a little like urine, but not totally and mother told her to call the office for advice.  Patient is asking to speak to the nurse or Marcelino DusterMichelle, please advise, thanks.

## 2018-03-02 NOTE — Telephone Encounter (Signed)
Called pt advised to put on a pad, and observe, she voiced understanding

## 2018-03-05 ENCOUNTER — Ambulatory Visit (INDEPENDENT_AMBULATORY_CARE_PROVIDER_SITE_OTHER): Payer: Commercial Managed Care - PPO | Admitting: Certified Nurse Midwife

## 2018-03-05 ENCOUNTER — Other Ambulatory Visit: Payer: Self-pay | Admitting: Certified Nurse Midwife

## 2018-03-05 VITALS — BP 108/62 | HR 107 | Wt 187.0 lb

## 2018-03-05 DIAGNOSIS — Z3482 Encounter for supervision of other normal pregnancy, second trimester: Secondary | ICD-10-CM

## 2018-03-05 DIAGNOSIS — Z3A18 18 weeks gestation of pregnancy: Secondary | ICD-10-CM

## 2018-03-05 DIAGNOSIS — O99212 Obesity complicating pregnancy, second trimester: Secondary | ICD-10-CM

## 2018-03-05 DIAGNOSIS — O9989 Other specified diseases and conditions complicating pregnancy, childbirth and the puerperium: Secondary | ICD-10-CM

## 2018-03-05 DIAGNOSIS — O99210 Obesity complicating pregnancy, unspecified trimester: Secondary | ICD-10-CM

## 2018-03-05 DIAGNOSIS — R300 Dysuria: Secondary | ICD-10-CM

## 2018-03-05 DIAGNOSIS — Z833 Family history of diabetes mellitus: Secondary | ICD-10-CM

## 2018-03-05 DIAGNOSIS — O26892 Other specified pregnancy related conditions, second trimester: Secondary | ICD-10-CM

## 2018-03-05 DIAGNOSIS — Z3689 Encounter for other specified antenatal screening: Secondary | ICD-10-CM

## 2018-03-05 LAB — POCT URINALYSIS DIPSTICK
Bilirubin, UA: NEGATIVE
GLUCOSE UA: NEGATIVE
Ketones, UA: NEGATIVE
LEUKOCYTES UA: NEGATIVE
NITRITE UA: NEGATIVE
Protein, UA: NEGATIVE
Spec Grav, UA: 1.025 (ref 1.010–1.025)
Urobilinogen, UA: 0.2 E.U./dL
pH, UA: 6 (ref 5.0–8.0)

## 2018-03-05 NOTE — Patient Instructions (Signed)
Second Trimester of Pregnancy The second trimester is from week 13 through week 28, month 4 through 6. This is often the time in pregnancy that you feel your best. Often times, morning sickness has lessened or quit. You may have more energy, and you may get hungry more often. Your unborn baby (fetus) is growing rapidly. At the end of the sixth month, he or she is about 9 inches long and weighs about 1 pounds. You will likely feel the baby move (quickening) between 18 and 20 weeks of pregnancy. Follow these instructions at home:  Avoid all smoking, herbs, and alcohol. Avoid drugs not approved by your doctor.  Do not use any tobacco products, including cigarettes, chewing tobacco, and electronic cigarettes. If you need help quitting, ask your doctor. You may get counseling or other support to help you quit.  Only take medicine as told by your doctor. Some medicines are safe and some are not during pregnancy.  Exercise only as told by your doctor. Stop exercising if you start having cramps.  Eat regular, healthy meals.  Wear a good support bra if your breasts are tender.  Do not use hot tubs, steam rooms, or saunas.  Wear your seat belt when driving.  Avoid raw meat, uncooked cheese, and liter boxes and soil used by cats.  Take your prenatal vitamins.  Take 1500-2000 milligrams of calcium daily starting at the 20th week of pregnancy until you deliver your baby.  Try taking medicine that helps you poop (stool softener) as needed, and if your doctor approves. Eat more fiber by eating fresh fruit, vegetables, and whole grains. Drink enough fluids to keep your pee (urine) clear or pale yellow.  Take warm water baths (sitz baths) to soothe pain or discomfort caused by hemorrhoids. Use hemorrhoid cream if your doctor approves.  If you have puffy, bulging veins (varicose veins), wear support hose. Raise (elevate) your feet for 15 minutes, 3-4 times a day. Limit salt in your diet.  Avoid heavy  lifting, wear low heals, and sit up straight.  Rest with your legs raised if you have leg cramps or low back pain.  Visit your dentist if you have not gone during your pregnancy. Use a soft toothbrush to brush your teeth. Be gentle when you floss.  You can have sex (intercourse) unless your doctor tells you not to.  Go to your doctor visits. Get help if:  You feel dizzy.  You have mild cramps or pressure in your lower belly (abdomen).  You have a nagging pain in your belly area.  You continue to feel sick to your stomach (nauseous), throw up (vomit), or have watery poop (diarrhea).  You have bad smelling fluid coming from your vagina.  You have pain with peeing (urination). Get help right away if:  You have a fever.  You are leaking fluid from your vagina.  You have spotting or bleeding from your vagina.  You have severe belly cramping or pain.  You lose or gain weight rapidly.  You have trouble catching your breath and have chest pain.  You notice sudden or extreme puffiness (swelling) of your face, hands, ankles, feet, or legs.  You have not felt the baby move in over an hour.  You have severe headaches that do not go away with medicine.  You have vision changes. This information is not intended to replace advice given to you by your health care provider. Make sure you discuss any questions you have with your health care   provider. Document Released: 10/05/2009 Document Revised: 12/17/2015 Document Reviewed: 09/11/2012 Elsevier Interactive Patient Education  2017 Elsevier Inc.  

## 2018-03-05 NOTE — Progress Notes (Signed)
Pt presents today for 18 wk chk. Pt complains of sharp pain on her lower right side towards her pelvis when she urinates.

## 2018-03-05 NOTE — Progress Notes (Signed)
ROB-Discussed round ligament pain and home treatment measures including use of abdominal support; handouts given. Having female, naming him "Kellie ShropshireGavin".  Anticipatory guidance regarding course of prenatal care. Reviewed red flag symptoms and when to call. RTC x 2-3 weeks for early glucola, anatomy scan and ROB or sooner if needed.

## 2018-03-19 ENCOUNTER — Ambulatory Visit (INDEPENDENT_AMBULATORY_CARE_PROVIDER_SITE_OTHER): Payer: Commercial Managed Care - PPO | Admitting: Certified Nurse Midwife

## 2018-03-19 ENCOUNTER — Ambulatory Visit (INDEPENDENT_AMBULATORY_CARE_PROVIDER_SITE_OTHER): Payer: Commercial Managed Care - PPO

## 2018-03-19 ENCOUNTER — Encounter: Payer: Self-pay | Admitting: Certified Nurse Midwife

## 2018-03-19 ENCOUNTER — Ambulatory Visit: Payer: Commercial Managed Care - PPO

## 2018-03-19 VITALS — BP 113/73 | HR 88 | Wt 188.1 lb

## 2018-03-19 DIAGNOSIS — Z363 Encounter for antenatal screening for malformations: Secondary | ICD-10-CM | POA: Diagnosis not present

## 2018-03-19 DIAGNOSIS — Z3482 Encounter for supervision of other normal pregnancy, second trimester: Secondary | ICD-10-CM

## 2018-03-19 DIAGNOSIS — Z3689 Encounter for other specified antenatal screening: Secondary | ICD-10-CM

## 2018-03-19 LAB — POCT URINALYSIS DIPSTICK OB
Bilirubin, UA: NEGATIVE
Blood, UA: NEGATIVE
LEUKOCYTES UA: NEGATIVE
Nitrite, UA: NEGATIVE
PH UA: 5 (ref 5.0–8.0)
POC,PROTEIN,UA: NEGATIVE
Spec Grav, UA: 1.02 (ref 1.010–1.025)
Urobilinogen, UA: 0.2 E.U./dL

## 2018-03-19 NOTE — Progress Notes (Signed)
Pt is here for an ROB visit. Has scan and early 1 hour.

## 2018-03-19 NOTE — Patient Instructions (Signed)

## 2018-03-19 NOTE — Progress Notes (Signed)
  ROB doing well. Ultrasound today WNL (see below). She had early glucose screen today. Will follow up with result. ROB in 4 wks.   Doreene BurkeAnnie Shaine Mount, CNM   ULTRASOUND REPORT  Location: [facility] Date of Service:  03/19/2018  Indications: Anatomy Findings:  Mason JimSingleton intrauterine pregnancy is visualized with FHR at 136 BPM. Biometrics give an (U/S) Gestational age of 23 6/7 weeks and an (U/S) EDD of 07/31/18; this correlates with the clinically established EDD of 08/02/18.  Fetal presentation is vertex.  EFW: 361 grams (0lb 13oz). Placenta: Posterior and grade 1. AFI: WNL subjectively.  Anatomic survey is complete and appears grossly WNL; Gender - Female.  Extremely limited exam (suboptimal images) due to maternal body habitus.   Right Ovary measures 3.2 x 2.0 x 1.4 cm. It is normal in appearance. Left Ovary measures 2.8 x 2.5 x 2.1 cm. It is normal appearance. There is no obvious evidence of a corpus luteal cyst. Survey of the adnexa demonstrates no adnexal masses. There is no free peritoneal fluid in the cul de sac.  Impression: 1. 20 6/7 week Viable Singleton Intrauterine pregnancy by U/S. 2. (U/S) EDD is consistent with Clinically established (LMP) EDD of 08/02/18. 3. Grossly Normal Anatomy Scan - Extremely limited due to maternal body habitus.  Recommendations: 1.Clinical correlation with the patient's History and Physical Exam.  Kari BaarsJill Long, RDMS

## 2018-03-20 ENCOUNTER — Other Ambulatory Visit: Payer: Self-pay | Admitting: Certified Nurse Midwife

## 2018-03-20 ENCOUNTER — Encounter: Payer: Self-pay | Admitting: Certified Nurse Midwife

## 2018-03-20 ENCOUNTER — Telehealth: Payer: Self-pay | Admitting: Certified Nurse Midwife

## 2018-03-20 DIAGNOSIS — O24419 Gestational diabetes mellitus in pregnancy, unspecified control: Secondary | ICD-10-CM | POA: Insufficient documentation

## 2018-03-20 LAB — GLUCOSE TOLERANCE, 1 HOUR: GLUCOSE, 1HR PP: 211 mg/dL — AB (ref 65–199)

## 2018-03-20 NOTE — Telephone Encounter (Signed)
The patient called asking about her 1 HR GTT results; abnormal, and concerned because she was told she could eat and did not have to fast.  She was told protein and veggies only, no pasta, bread or sugar.  She expressed an understanding and was also asked to MyChart her provider as to how to proceed and to discuss her options b/c front desk personnel are not clinical staff and we are not allowed to give medical advice.  She expressed understanding, as well, and stated she would send Marcelino DusterMichelle a MyChart message today.  **NO FURTHER ACTION needed at this time**

## 2018-03-21 LAB — URINE CULTURE

## 2018-03-22 ENCOUNTER — Encounter: Payer: Self-pay | Admitting: Certified Nurse Midwife

## 2018-03-23 ENCOUNTER — Ambulatory Visit: Payer: BLUE CROSS/BLUE SHIELD | Admitting: *Deleted

## 2018-03-30 ENCOUNTER — Ambulatory Visit: Payer: Medicaid Other | Admitting: *Deleted

## 2018-04-07 ENCOUNTER — Encounter: Payer: Self-pay | Admitting: Certified Nurse Midwife

## 2018-04-09 ENCOUNTER — Encounter: Payer: Commercial Managed Care - PPO | Attending: Certified Nurse Midwife | Admitting: *Deleted

## 2018-04-09 ENCOUNTER — Encounter: Payer: Self-pay | Admitting: *Deleted

## 2018-04-09 VITALS — BP 110/74 | Ht 61.0 in | Wt 189.4 lb

## 2018-04-09 DIAGNOSIS — Z713 Dietary counseling and surveillance: Secondary | ICD-10-CM | POA: Diagnosis not present

## 2018-04-09 DIAGNOSIS — O24410 Gestational diabetes mellitus in pregnancy, diet controlled: Secondary | ICD-10-CM | POA: Diagnosis not present

## 2018-04-09 NOTE — Progress Notes (Signed)
Diabetes Self-Management Education  Visit Type: First/Initial  Appt. Start Time: 1330 Appt. End Time: 1500  04/09/2018  [name], identified by name and date of birth, is a 22 y.o. female with a diagnosis of Diabetes: Gestational Diabetes.   ASSESSMENT  Blood pressure 110/74, height 5\' 1"  (1.549 m), weight 189 lb 6.4 oz (85.9 kg). Body mass index is 35.79 kg/m.  Diabetes Self-Management Education - 04/09/18 1536      Visit Information   Visit Type  First/Initial      Initial Visit   Diabetes Type  Gestational Diabetes    Are you currently following a meal plan?  No    Are you taking your medications as prescribed?  Yes    Date Diagnosed  August  - GTT done      Health Coping   How would you rate your overall health?  Good;Fair      Psychosocial Assessment   Patient Belief/Attitude about Diabetes  Defeat/Burnout   "At first like I had failed, now I'm okay since my dad helps with food"   Self-care barriers  None    Self-management support  Doctor's office;Friends;Family    Patient Concerns  Nutrition/Meal planning;Weight Control;Glycemic Control    Special Needs  None    Preferred Learning Style  Auditory;Other (comment)   writing   Learning Readiness  Ready    How often do you need to have someone help you when you read instructions, pamphlets, or other written materials from your doctor or pharmacy?  1 - Never    What is the last grade level you completed in school?  12th       Pre-Education Assessment   Patient understands the diabetes disease and treatment process.  Needs Instruction    Patient understands incorporating nutritional management into lifestyle.  Needs Instruction    Patient undertands incorporating physical activity into lifestyle.  Needs Instruction    Patient understands using medications safely.  Needs Instruction    Patient understands monitoring blood glucose, interpreting and using results  Needs Review    Patient understands  prevention, detection, and treatment of acute complications.  Needs Instruction    Patient understands prevention, detection, and treatment of chronic complications.  Needs Instruction    Patient understands how to develop strategies to address psychosocial issues.  Needs Instruction    Patient understands how to develop strategies to promote health/change behavior.  Needs Instruction      Complications   How often do you check your blood sugar?  1-2 times/day    Fasting Blood glucose range (mg/dL)  16-109   Pt reports FBG's 90-92 mg/dL   Postprandial Blood glucose range (mg/dL)  604-540   pt reports 1 hr pp 140's mg/dL    Have you had a dilated eye exam in the past 12 months?  No    Have you had a dental exam in the past 12 months?  Yes    Are you checking your feet?  No      Dietary Intake   Breakfast  eggs, aisan hot dog, rice, milk    Snack (morning)  fruit snacks    Lunch  grilled chicken and vegetables    Snack (afternoon)  pretzels, fruit snacks    Dinner  no red meat or fish; eats chicken, spaghetti, potatoes, peas, beans, corn, carrots, onions, asparagus, spinach, broccoli, green beans    Snack (evening)  fruit snacks    Beverage(s)  water, 1 cup coffee, sugar sweetened tea  Exercise   Exercise Type  Light (walking / raking leaves)    How many days per week to you exercise?  4    How many minutes per day do you exercise?  40    Total minutes per week of exercise  160      Patient Education   Previous Diabetes Education  No    Disease state   Definition of diabetes, type 1 and 2, and the diagnosis of diabetes;Factors that contribute to the development of diabetes    Nutrition management   Role of diet in the treatment of diabetes and the relationship between the three main macronutrients and blood glucose level;Food label reading, portion sizes and measuring food.;Reviewed blood glucose goals for pre and post meals and how to evaluate the patients' food intake on their  blood glucose level.    Physical activity and exercise   Role of exercise on diabetes management, blood pressure control and cardiac health.    Monitoring  Purpose and frequency of SMBG.;Taught/discussed recording of test results and interpretation of SMBG.;Ketone testing, when, how.    Chronic complications  Relationship between chronic complications and blood glucose control    Psychosocial adjustment  Identified and addressed patients feelings and concerns about diabetes    Preconception care  Pregnancy and GDM  Role of pre-pregnancy blood glucose control on the development of the fetus;Role of family planning for patients with diabetes;Reviewed with patient blood glucose goals with pregnancy      Individualized Goals (developed by patient)   Reducing Risk  Improve blood sugars Lose weight     Outcomes   Expected Outcomes  Demonstrated interest in learning. Expect positive outcomes       Individualized Plan for Diabetes Self-Management Training:   Learning Objective:  Patient will have a greater understanding of diabetes self-management. Patient education plan is to attend individual and/or group sessions per assessed needs and concerns.   Plan:   Patient Instructions  Read booklet on Gestational Diabetes Follow Gestational Meal Planning Guidelines Avoid sugar sweetened drinks (tea) Limit packaged fruit snacks Complete a 3 Day Food Record and bring to next appointment Check blood sugars 4 x day - before breakfast and 2 hrs after every meal and record  Bring blood sugar log to all appointments Purchase urine ketone strips if blood sugars not controlled and check urine ketones every am:  If + increase bedtime snack to 1 protein and 2 carbohydrate servings Walk 20-30 minutes at least 5 x week if permitted by MD  Expected Outcomes:  Demonstrated interest in learning. Expect positive outcomes  Education material provided:  Read booklet on Gestational Diabetes Follow Gestational  Meal Planning Guidelines Avoid sugar sweetened drinks (tea) Limit packaged fruit snacks Complete a 3 Day Food Record and bring to next appointment Check blood sugars 4 x day - before breakfast and 2 hrs after every meal and record  Bring blood sugar log to all appointments Call MD for prescription for meter strips and lancets that you have at home  Purchase urine ketone strips if blood sugars not controlled and check urine ketones every am:  If + increase bedtime snack to 1 protein and 2 carbohydrate servings Walk 20-30 minutes at least 5 x week if permitted by MD  If problems or questions, patient to contact team via:  Holly SettlerSheila Storm Dulski, RN, CCM, CDE [phone]  Future DSME appointment:  April 17, 2018 with the dietitian

## 2018-04-09 NOTE — Patient Instructions (Addendum)
Read booklet on Gestational Diabetes Follow Gestational Meal Planning Guidelines Avoid sugar sweetened drinks (tea) Limit packaged fruit snacks Complete a 3 Day Food Record and bring to next appointment Check blood sugars 4 x day - before breakfast and 2 hrs after every meal and record  Bring blood sugar log to all appointments Call MD for prescription for meter strips and lancets that you have at home  Purchase urine ketone strips if blood sugars not controlled and check urine ketones every am:  If + increase bedtime snack to 1 protein and 2 carbohydrate servings Walk 20-30 minutes at least 5 x week if permitted by MD

## 2018-04-10 ENCOUNTER — Telehealth: Payer: Self-pay

## 2018-04-10 NOTE — Telephone Encounter (Signed)
Call transferred from the front desk-  OB- 23 5/7  Pt states she has only felt her baby only once since 10pm. NO vb. She did have slight left sided pain yesterday. NO meds needed. NO UTI sx. BM- wnl. Pos h20 intake. Pt has had breakfast and lunch today.   Advised pt to lie down on left side, eat/drink something sweet. Call office with an update by 2pm. Pt voices understanding.

## 2018-04-10 NOTE — Telephone Encounter (Signed)
Called pt and after she had some ice cream baby was moving.

## 2018-04-16 ENCOUNTER — Ambulatory Visit (INDEPENDENT_AMBULATORY_CARE_PROVIDER_SITE_OTHER): Payer: Commercial Managed Care - PPO | Admitting: Certified Nurse Midwife

## 2018-04-16 VITALS — BP 126/74 | HR 86 | Wt 191.3 lb

## 2018-04-16 DIAGNOSIS — Z3492 Encounter for supervision of normal pregnancy, unspecified, second trimester: Secondary | ICD-10-CM | POA: Diagnosis not present

## 2018-04-16 LAB — POCT URINALYSIS DIPSTICK OB
Glucose, UA: NEGATIVE
Ketones, UA: 15
Leukocytes, UA: NEGATIVE
Nitrite, UA: NEGATIVE
POC,PROTEIN,UA: NEGATIVE
RBC UA: NEGATIVE
SPEC GRAV UA: 1.015 (ref 1.010–1.025)
UROBILINOGEN UA: 0.2 U/dL
pH, UA: 6 (ref 5.0–8.0)

## 2018-04-16 NOTE — Progress Notes (Signed)
ROB- pt is doing well, having some nausea in the am, declined flu vaccine today

## 2018-04-16 NOTE — Patient Instructions (Signed)
Gestational Diabetes Mellitus, Diagnosis Gestational diabetes (gestational diabetes mellitus) is a short-term (temporary) form of diabetes that can happen during pregnancy. It goes away after you give birth. It may be caused by one or both of these problems:  Your body does not make enough of a hormone called insulin.  Your body does not respond in a normal way to insulin that it makes.  Insulin lets sugars (glucose) go into cells in the body. This gives you energy. If you have diabetes, sugars cannot get into cells. This causes high blood sugar (hyperglycemia). If diabetes is treated, it may not hurt you or your baby. Your doctor will set treatment goals for you. In general, you should have these blood sugar levels:  After not eating for a long time (fasting): 95 mg/dL (5.3 mmol/L).  After meals (postprandial): ? One hour after a meal: at or below 140 mg/dL (7.8 mmol/L). ? Two hours after a meal: at or below 120 mg/dL (6.7 mmol/L).  A1c (hemoglobin A1c) level: 6-6.5%.  Follow these instructions at home: Questions to Ask Your Doctor  You may want to ask these questions:  Do I need to meet with a diabetes educator?  Where can I find a support group for people with diabetes?  What equipment will I need to care for myself at home?  What diabetes medicines do I need? When should I take them?  How often do I need to check my blood sugar?  What number can I call if I have questions?  When is my next doctor's visit?  General instructions  Take over-the-counter and prescription medicines only as told by your doctor.  Stay at a healthy weight during pregnancy.  Keep all follow-up visits as told by your doctor. This is important. Contact a doctor if:  Your blood sugar is at or above 240 mg/dL (13.3 mmol/L).  Your blood sugar is at or above 200 mg/dL (11.1 mmol/L) and you have ketones in your pee (urine).  You have been sick or have had a fever for 2 days or more and you are  not getting better.  You have any of these problems for more than 6 hours: ? You cannot eat or drink. ? You feel sick to your stomach (nauseous). ? You throw up (vomit). ? You have watery poop (diarrhea). Get help right away if:  Your blood sugar is lower than 54 mg/dL (3 mmol/L).  You get confused.  You have trouble: ? Thinking clearly. ? Breathing.  Your baby moves less than normal.  You have: ? Moderate or large ketone levels in your pee (urine). ? Bleeding from your vagina. ? Unusual fluid coming from your vagina. ? Early contractions. These may feel like tightness in your belly. This information is not intended to replace advice given to you by your health care provider. Make sure you discuss any questions you have with your health care provider. Document Released: 11/02/2015 Document Revised: 12/17/2015 Document Reviewed: 08/14/2015 Elsevier Interactive Patient Education  2018 [employer]. Common Medications Safe in Pregnancy  Acne:      Constipation:  Benzoyl Peroxide     Colace  Clindamycin      Dulcolax Suppository  Topica Erythromycin     Fibercon  Salicylic Acid      Metamucil         Miralax AVOID:        Senakot   Accutane    Cough:  Retin-A       Cough Drops  Tetracycline  Phenergan w/ Codeine if Rx  Minocycline      Robitussin (Plain & DM)  Antibiotics:     Crabs/Lice:  Ceclor       RID  Cephalosporins    AVOID:  E-Mycins      Kwell  Keflex  Macrobid/Macrodantin   Diarrhea:  Penicillin      Kao-Pectate  Zithromax      Imodium AD         PUSH FLUIDS AVOID:       Cipro     Fever:  Tetracycline      Tylenol (Regular or Extra  Minocycline       Strength)  Levaquin      Extra Strength-Do not          Exceed 8 tabs/24 hrs Caffeine:        <259m/day (equiv. To 1 cup of coffee or  approx. 3 12 oz  sodas)         Gas: Cold/Hayfever:       Gas-X  Benadryl      Mylicon  Claritin       Phazyme  **Claritin-D        Chlor-Trimeton    Headaches:  Dimetapp      ASA-Free Excedrin  Drixoral-Non-Drowsy     Cold Compress  Mucinex (Guaifenasin)     Tylenol (Regular or Extra  Sudafed/Sudafed-12 Hour     Strength)  **Sudafed PE Pseudoephedrine   Tylenol Cold & Sinus     Vicks Vapor Rub  Zyrtec  **AVOID if Problems With Blood Pressure         Heartburn: Avoid lying down for at least 1 hour after meals  Aciphex      Maalox     Rash:  Milk of Magnesia     Benadryl    Mylanta       1% Hydrocortisone Cream  Pepcid  Pepcid Complete   Sleep Aids:  Prevacid      Ambien   Prilosec       Benadryl  Rolaids       Chamomile Tea  Tums (Limit 4/day)     Unisom  Zantac       Tylenol PM         Warm milk-add vanilla or  Hemorrhoids:       Sugar for taste  Anusol/Anusol H.C.  (RX: Analapram 2.5%)  Sugar Substitutes:  Hydrocortisone OTC     Ok in moderation  Preparation H      Tucks        Vaseline lotion applied to tissue with wiping    Herpes:     Throat:  Acyclovir      Oragel  Famvir  Valtrex     Vaccines:         Flu Shot Leg Cramps:       *Gardasil  Benadryl      Hepatitis A         Hepatitis B Nasal Spray:       Pneumovax  Saline Nasal Spray     Polio Booster         Tetanus Nausea:       Tuberculosis test or PPD  Vitamin B6 25 mg TID   AVOID:    Dramamine      *Gardasil  Emetrol       Live Poliovirus  Ginger Root 250 mg QID    MMR (measles, mumps &  High Complex Carbs @ Bedtime    rebella)  Sea Bands-Accupressure    Varicella (Chickenpox)  Unisom 1/2 tab TID     *No known complications           If received before Pain:         Known pregnancy;   Darvocet       Resume series after  Lortab        Delivery  Percocet    Yeast:   Tramadol      Femstat  Tylenol 3      Gyne-lotrimin  Ultram       Monistat  Vicodin           MISC:         All  Sunscreens           Hair Coloring/highlights          Insect Repellant's          (Including DEET)         Mystic Tans Second Trimester of Pregnancy The second trimester is from week 13 through week 28, month 4 through 6. This is often the time in pregnancy that you feel your best. Often times, morning sickness has lessened or quit. You may have more energy, and you may get hungry more often. Your unborn baby (fetus) is growing rapidly. At the end of the sixth month, he or she is about 9 inches long and weighs about 1 pounds. You will likely feel the baby move (quickening) between 18 and 20 weeks of pregnancy. Follow these instructions at home:  Avoid all smoking, herbs, and alcohol. Avoid drugs not approved by your doctor.  Do not use any tobacco products, including cigarettes, chewing tobacco, and electronic cigarettes. If you need help quitting, ask your doctor. You may get counseling or other support to help you quit.  Only take medicine as told by your doctor. Some medicines are safe and some are not during pregnancy.  Exercise only as told by your doctor. Stop exercising if you start having cramps.  Eat regular, healthy meals.  Wear a good support bra if your breasts are tender.  Do not use hot tubs, steam rooms, or saunas.  Wear your seat belt when driving.  Avoid raw meat, uncooked cheese, and liter boxes and soil used by cats.  Take your prenatal vitamins.  Take 1500-2000 milligrams of calcium daily starting at the 20th week of pregnancy until you deliver your baby.  Try taking medicine that helps you poop (stool softener) as needed, and if your doctor approves. Eat more fiber by eating fresh fruit, vegetables, and whole grains. Drink enough fluids to keep your pee (urine) clear or pale yellow.  Take warm water baths (sitz baths) to soothe pain or discomfort caused by hemorrhoids. Use hemorrhoid cream if your doctor approves.  If you have puffy, bulging veins (varicose  veins), wear support hose. Raise (elevate) your feet for 15 minutes, 3-4 times a day. Limit salt in your diet.  Avoid heavy lifting, wear low heals, and sit up straight.  Rest with your legs raised if you have leg cramps or low back pain.  Visit your dentist if you have not gone during your pregnancy. Use a soft toothbrush to brush your teeth. Be gentle when you floss.  You can have sex (intercourse) unless your doctor tells you not to.  Go to your doctor visits. Get help if:  You feel dizzy.  You have mild cramps or pressure in your lower belly (abdomen).  You have a nagging  pain in your belly area.  You continue to feel sick to your stomach (nauseous), throw up (vomit), or have watery poop (diarrhea).  You have bad smelling fluid coming from your vagina.  You have pain with peeing (urination). Get help right away if:  You have a fever.  You are leaking fluid from your vagina.  You have spotting or bleeding from your vagina.  You have severe belly cramping or pain.  You lose or gain weight rapidly.  You have trouble catching your breath and have chest pain.  You notice sudden or extreme puffiness (swelling) of your face, hands, ankles, feet, or legs.  You have not felt the baby move in over an hour.  You have severe headaches that do not go away with medicine.  You have vision changes. This information is not intended to replace advice given to you by your health care provider. Make sure you discuss any questions you have with your health care provider. Document Released: 10/05/2009 Document Revised: 12/17/2015 Document Reviewed: 09/11/2012 Elsevier Interactive Patient Education  2017 [employer].

## 2018-04-17 ENCOUNTER — Ambulatory Visit: Payer: Commercial Managed Care - PPO | Admitting: Dietician

## 2018-04-18 NOTE — Progress Notes (Signed)
ROB-Doing well, presents without blood sugar log. Feels like he blood sugars are "fine". Discussed importance of diabetes management. Advised to send log via MyChart. Education regarding maternal and fetal risks associated with unmanaged diabetes and practice policies regarding midwifery care, patient verbalized understanding. Reviewed red flag symptoms and when to call. RTC x 1-2 weeks for ROB with blood sugar log or sooner if needed.

## 2018-04-19 ENCOUNTER — Telehealth: Payer: Self-pay | Admitting: Dietician

## 2018-04-19 ENCOUNTER — Encounter: Payer: Self-pay | Admitting: Certified Nurse Midwife

## 2018-04-19 NOTE — Telephone Encounter (Signed)
Called patient to reschedule her missed appointment from 04/17/18; rescheduled to 04/30/18.

## 2018-04-20 ENCOUNTER — Other Ambulatory Visit: Payer: Self-pay

## 2018-04-20 ENCOUNTER — Telehealth: Payer: Self-pay | Admitting: Certified Nurse Midwife

## 2018-04-20 MED ORDER — GLUCOSE BLOOD VI STRP: ORAL_STRIP | 0 refills | Status: DC

## 2018-04-20 MED ORDER — GLUCOSE BLOOD VI STRP: ORAL_STRIP | 12 refills | Status: DC

## 2018-04-20 NOTE — Telephone Encounter (Signed)
The patient called and stated that she would like to speak with a nurse in regards to having "Lancit" sent into her pharmacy to check her sugar. The pt stated that it is for a one touch ultra mini. Please advise.

## 2018-04-20 NOTE — Telephone Encounter (Signed)
Script faxed for lancets

## 2018-04-23 ENCOUNTER — Telehealth: Payer: Self-pay | Admitting: Certified Nurse Midwife

## 2018-04-23 ENCOUNTER — Other Ambulatory Visit: Payer: Self-pay

## 2018-04-23 ENCOUNTER — Telehealth: Payer: Self-pay

## 2018-04-23 NOTE — Telephone Encounter (Signed)
Returned pts call- left message on voicemail- the script for testing lancets was sent to pharmacy on 04/20/18 with confirmation of receipt. Informed pt of this mychart message and to please let me know if there is anything else she needs.

## 2018-04-23 NOTE — Telephone Encounter (Signed)
The patient called and stated that she has been w/o her diabetic sugar testing strips since Saturday and needs the prescription/insurance issue taken care of to get the patient what she needs, The patient is requesting a call back from a nurse. Please advise.

## 2018-04-24 ENCOUNTER — Telehealth: Payer: Self-pay | Admitting: Certified Nurse Midwife

## 2018-04-24 ENCOUNTER — Other Ambulatory Visit: Payer: Self-pay

## 2018-04-24 MED ORDER — GLUCOSE BLOOD VI STRP: ORAL_STRIP | 0 refills | Status: DC

## 2018-04-24 MED ORDER — ACCU-CHEK FASTCLIX LANCETS MISC: 1.0000 [IU] | Freq: Four times a day (QID) | 12 refills | Status: DC

## 2018-04-24 MED ORDER — GLUCOSE BLOOD VI STRP: ORAL_STRIP | 12 refills | Status: DC

## 2018-04-24 NOTE — Telephone Encounter (Signed)
Can you please handle this. Thanks, JML

## 2018-04-24 NOTE — Telephone Encounter (Signed)
The patient has been trying since last week to get her test strips from [facility] in [place].  She just called them back and asked them to send [place] the pre-authorization form and when she asked for that, they stated they just got what they needed to be able to help her and fill her script.  However, they are stating that she cannot get the Ultra Blue but she can get the AccuView Chek.  She stated she is going to try to use these; however, it will be 3-5 bus days before they can get them to her.  She needs to know if she can reschedule her 10/7 appt due to the fact that she will not have her strips in time to keep a good log to bring back on Monday, please advise, thanks.

## 2018-04-25 ENCOUNTER — Other Ambulatory Visit: Payer: Self-pay

## 2018-04-25 ENCOUNTER — Telehealth: Payer: Self-pay | Admitting: Certified Nurse Midwife

## 2018-04-25 MED ORDER — ACCU-CHEK FASTCLIX LANCETS MISC: 1.0000 [IU] | Freq: Four times a day (QID) | 12 refills | Status: DC

## 2018-04-25 MED ORDER — GLUCOSE BLOOD VI STRP: ORAL_STRIP | 12 refills | Status: DC

## 2018-04-25 NOTE — Telephone Encounter (Signed)
The patient's mother just called and asked if we could send in a script for the patient's new glucometer, AccuChek Guide, as this is the one that she has the test strips and lancets for now as the other glucometer would not work with the lancets or strips the patient was trying to get as the pharmacy could not get them for her.  The mother stated that this is all the patient now needs.  The patient is also keeping her Monday appointment and knows to bring her "blood sugar" log with her, please advise, thanks.

## 2018-04-27 ENCOUNTER — Other Ambulatory Visit: Payer: Self-pay

## 2018-04-30 ENCOUNTER — Encounter: Payer: Self-pay | Admitting: Dietician

## 2018-04-30 ENCOUNTER — Encounter: Payer: Commercial Managed Care - PPO | Attending: Certified Nurse Midwife | Admitting: Dietician

## 2018-04-30 ENCOUNTER — Ambulatory Visit (INDEPENDENT_AMBULATORY_CARE_PROVIDER_SITE_OTHER): Payer: Commercial Managed Care - PPO | Admitting: Certified Nurse Midwife

## 2018-04-30 VITALS — BP 116/70 | Ht 61.0 in | Wt 193.0 lb

## 2018-04-30 VITALS — BP 106/50 | HR 80 | Wt 193.0 lb

## 2018-04-30 DIAGNOSIS — Z713 Dietary counseling and surveillance: Secondary | ICD-10-CM | POA: Diagnosis present

## 2018-04-30 DIAGNOSIS — O24410 Gestational diabetes mellitus in pregnancy, diet controlled: Secondary | ICD-10-CM | POA: Diagnosis not present

## 2018-04-30 DIAGNOSIS — Z3492 Encounter for supervision of normal pregnancy, unspecified, second trimester: Secondary | ICD-10-CM

## 2018-04-30 DIAGNOSIS — O24419 Gestational diabetes mellitus in pregnancy, unspecified control: Secondary | ICD-10-CM

## 2018-04-30 LAB — POCT URINALYSIS DIPSTICK OB
Bilirubin, UA: NEGATIVE
GLUCOSE, UA: NEGATIVE
Ketones, UA: NEGATIVE
Leukocytes, UA: NEGATIVE
NITRITE UA: NEGATIVE
POC,PROTEIN,UA: NEGATIVE
Spec Grav, UA: 1.015 (ref 1.010–1.025)
Urobilinogen, UA: 2 E.U./dL — AB
pH, UA: 6 (ref 5.0–8.0)

## 2018-04-30 NOTE — Progress Notes (Signed)
ROB-Presents without blood sugar log. Follow up [facility] appointment today. Reports fasting levels wnl and elevated levels after dinner. Discussed importance of nutrition and exercise. Advised patient to send glucose log via MyChart (weekly) or start a paper log. Reviewed red flag symptoms and when to call. RTC x 2 weeks for Growth [place] and ROB or sooner if needed.

## 2018-04-30 NOTE — Progress Notes (Signed)
   Patient's verbal report of BG record indicates fasting BGs ranging 80-95, with one reading of 104 today; post-meal BGs are ranging from 120s - 140s generally.   Patient's food recall indicates likely some inconsistent carbohydrate intake; she loves mashed potatoes and has been eating some larger portions recently.  She sometimes misses breakfast due to lack of hunger.   Provided 1600kcal meal plan, and wrote individualized menus based on patient's food preferences. Advised her to have a small breakfast, perhaps a glass of milk if unable to eat.   Instructed patient on food safety, including avoidance of Listeriosis, and limiting mercury from fish.  Discussed importance of maintaining healthy lifestyle habits to reduce risk of Type 2 DM as well as Gestational DM with any future pregnancies.  Advised patient to use any remaining testing supplies to test some BGs after delivery, and to have BG tested ideally annually, as well as prior to attempting future pregnancies.

## 2018-04-30 NOTE — Progress Notes (Signed)
ROB, c/o irregular BH's.  Pt will send blood sugar readings through my chart.

## 2018-04-30 NOTE — Patient Instructions (Signed)
   Keep working to control portions of carb foods, small portions of mashed potatoes.   Good job making healthy food choices and including some exercise!  If you have eaten a larger meal, try walking/ moving lightly for about 15 minutes afterwards to help prevent high blood sugar.

## 2018-04-30 NOTE — Patient Instructions (Signed)

## 2018-05-03 ENCOUNTER — Other Ambulatory Visit: Payer: Self-pay | Admitting: Certified Nurse Midwife

## 2018-05-03 DIAGNOSIS — O24419 Gestational diabetes mellitus in pregnancy, unspecified control: Secondary | ICD-10-CM

## 2018-05-10 ENCOUNTER — Encounter: Payer: Self-pay | Admitting: Certified Nurse Midwife

## 2018-05-16 ENCOUNTER — Telehealth: Payer: Self-pay | Admitting: Certified Nurse Midwife

## 2018-05-16 ENCOUNTER — Ambulatory Visit (INDEPENDENT_AMBULATORY_CARE_PROVIDER_SITE_OTHER): Payer: Commercial Managed Care - PPO | Admitting: Certified Nurse Midwife

## 2018-05-16 ENCOUNTER — Encounter: Payer: Self-pay | Admitting: Certified Nurse Midwife

## 2018-05-16 ENCOUNTER — Ambulatory Visit (INDEPENDENT_AMBULATORY_CARE_PROVIDER_SITE_OTHER): Payer: Commercial Managed Care - PPO

## 2018-05-16 VITALS — BP 111/89 | HR 96 | Wt 197.5 lb

## 2018-05-16 DIAGNOSIS — Z3A29 29 weeks gestation of pregnancy: Secondary | ICD-10-CM | POA: Diagnosis not present

## 2018-05-16 DIAGNOSIS — O24419 Gestational diabetes mellitus in pregnancy, unspecified control: Secondary | ICD-10-CM

## 2018-05-16 DIAGNOSIS — Z3403 Encounter for supervision of normal first pregnancy, third trimester: Secondary | ICD-10-CM

## 2018-05-16 DIAGNOSIS — Z23 Encounter for immunization: Secondary | ICD-10-CM | POA: Diagnosis not present

## 2018-05-16 DIAGNOSIS — O24410 Gestational diabetes mellitus in pregnancy, diet controlled: Secondary | ICD-10-CM | POA: Diagnosis not present

## 2018-05-16 LAB — POCT URINALYSIS DIPSTICK OB
BILIRUBIN UA: NEGATIVE
Glucose, UA: NEGATIVE
Leukocytes, UA: NEGATIVE
NITRITE UA: NEGATIVE
PH UA: 6 (ref 5.0–8.0)
RBC UA: NEGATIVE
SPEC GRAV UA: 1.025 (ref 1.010–1.025)
Urobilinogen, UA: 0.2 E.U./dL

## 2018-05-16 MED ORDER — TETANUS-DIPHTH-ACELL PERTUSSIS 5-2.5-18.5 LF-MCG/0.5 IM SUSP
0.5000 mL | Freq: Once | INTRAMUSCULAR | Status: AC
  Administered 2018-05-16: 0.5 mL via INTRAMUSCULAR

## 2018-05-16 NOTE — Patient Instructions (Signed)
Td Vaccine (Tetanus and Diphtheria): What You Need to Know 1. Why get vaccinated? Tetanus  and diphtheria are very serious diseases. They are rare in the [place] today, but people who do become infected often have severe complications. Td vaccine is used to protect adolescents and adults from both of these diseases. Both tetanus and diphtheria are infections caused by bacteria. Diphtheria spreads from person to person through coughing or sneezing. Tetanus-causing bacteria enter the body through cuts, scratches, or wounds. TETANUS (lockjaw) causes painful muscle tightening and stiffness, usually all over the body.  It can lead to tightening of muscles in the head and neck so you can't open your mouth, swallow, or sometimes even breathe. Tetanus kills about 1 out of every 10 people who are infected even after receiving the best medical care.  DIPHTHERIA can cause a thick coating to form in the back of the throat.  It can lead to breathing problems, paralysis, heart failure, and death.  Before vaccines, as many as 200,000 cases of diphtheria and hundreds of cases of tetanus were reported in the [place] each year. Since vaccination began, reports of cases for both diseases have dropped by about 99%. 2. Td vaccine Td vaccine can protect adolescents and adults from tetanus and diphtheria. Td is usually given as a booster dose every 10 years but it can also be given earlier after a severe and dirty wound or burn. Another vaccine, called Tdap, which protects against pertussis in addition to tetanus and diphtheria, is sometimes recommended instead of Td vaccine. Your doctor or the person giving you the vaccine can give you more information. Td may safely be given at the same time as other vaccines. 3. Some people should not get this vaccine  A person who has ever had a life-threatening allergic reaction after a previous dose of any tetanus or diphtheria containing vaccine, OR has a severe  allergy to any part of this vaccine, should not get Td vaccine. Tell the person giving the vaccine about any severe allergies.  Talk to your doctor if you: ? had severe pain or swelling after any vaccine containing diphtheria or tetanus, ? ever had a condition called Guillain Barre Syndrome (GBS), ? aren't feeling well on the day the shot is scheduled. 4. What are the risks from Td vaccine? With any medicine, including vaccines, there is a chance of side effects. These are usually mild and go away on their own. Serious reactions are also possible but are rare. Most people who get Td vaccine do not have any problems with it. Mild problems following Td vaccine: (Did not interfere with activities)  Pain where the shot was given (about 8 people in 10)  Redness or swelling where the shot was given (about 1 person in 4)  Mild fever (rare)  Headache (about 1 person in 4)  Tiredness (about 1 person in 4)  Moderate problems following Td vaccine: (Interfered with activities, but did not require medical attention)  Fever over 102F (rare)  Severe problems following Td vaccine: (Unable to perform usual activities; required medical attention)  Swelling, severe pain, bleeding and/or redness in the arm where the shot was given (rare).  Problems that could happen after any vaccine:  People sometimes faint after a medical procedure, including vaccination. Sitting or lying down for about 15 minutes can help prevent fainting, and injuries caused by a fall. Tell your doctor if you feel dizzy, or have vision changes or ringing in the ears.  Some people get   severe pain in the shoulder and have difficulty moving the arm where a shot was given. This happens very rarely.  Any medication can cause a severe allergic reaction. Such reactions from a vaccine are very rare, estimated at fewer than 1 in a million doses, and would happen within a few minutes to a few hours after the vaccination. As with any  medicine, there is a very remote chance of a vaccine causing a serious injury or death. The safety of vaccines is always being monitored. For more information, visit: www.cdc.gov/vaccinesafety/ 5. What if there is a serious reaction? What should I look for? Look for anything that concerns you, such as signs of a severe allergic reaction, very high fever, or unusual behavior. Signs of a severe allergic reaction can include hives, swelling of the face and throat, difficulty breathing, a fast heartbeat, dizziness, and weakness. These would usually start a few minutes to a few hours after the vaccination. What should I do?  If you think it is a severe allergic reaction or other emergency that can't wait, call 9-1-1 or get the person to the nearest hospital. Otherwise, call your doctor.  Afterward, the reaction should be reported to the Vaccine Adverse Event Reporting System (VAERS). Your doctor might file this report, or you can do it yourself through the VAERS web site at www.vaers.hhs.gov, or by calling 1-800-822-7967. ? VAERS does not give medical advice. 6. The National Vaccine Injury Compensation Program The National Vaccine Injury Compensation Program (VICP) is a federal program that was created to compensate people who may have been injured by certain vaccines. Persons who believe they may have been injured by a vaccine can learn about the program and about filing a claim by calling 1-800-338-2382 or visiting the VICP website at www.hrsa.gov/vaccinecompensation. There is a time limit to file a claim for compensation. 7. How can I learn more?  Ask your doctor. He or she can give you the vaccine package insert or suggest other sources of information.  Call your local or state health department.  Contact the Centers for Disease Control and Prevention (CDC): ? Call 1-800-232-4636 (1-800-CDC-INFO) ? Visit CDC's website at www.cdc.gov/vaccines CDC Td Vaccine VIS (11/03/15) This information is  not intended to replace advice given to you by your health care provider. Make sure you discuss any questions you have with your health care provider. Document Released: 05/08/2006 Document Revised: 03/31/2016 Document Reviewed: 03/31/2016 Elsevier Interactive Patient Education  2017 [employer]  

## 2018-05-16 NOTE — Progress Notes (Signed)
ROB, doing well. BS logs today. 2 elevated fasting sugars 105 & 110. 2 elevated 2 hr pp ( 147 & 135) all others WNL. Pt states she is exercise 2-3 days week. Pt was a transfer in to practice at 14 wks. NOB labs not on file ( did not receive records) . NOB labs completed with 28 wk draw. Tdap/BTC/ flu vaccine today as well. Discussed birth plan, volunteer doulas, and birth control after baby.  Ultrasound for growth WNL ( see below). Follow up 2 wks.   Doreene Burke, CNM   ULTRASOUND REPORT Location: [facility] Date of Service:  05/16/2018  Indications: Growth; GDM Findings:  Mason Jim intrauterine pregnancy is visualized with FHR at 136 BPM. Biometrics give an (U/S) Gestational age of 81 1/7 weeks and an (U/S) EDD of 07/31/18; this correlates with the clinically established EDD of 08/02/18.  Fetal presentation is vertex.  EFW: 1300 grams (2lb 14oz).  38th percentile.   Placenta: Posterior and grade 1. AFI: 13.7 cm.  Fetal stomach, kidneys, and bladder appear WNL.  Impression: 1. 29 1/7 week Viable Singleton Intrauterine pregnancy by U/S. 2. (U/S) EDD is consistent with Clinically established (LMP) EDD of 08/02/18. 3. EFW: 1300 grams (2lb 14oz).  38th percentile.  Recommendations: 1.Clinical correlation with the patient's History and Physical Exam.  Kari Baars, RDMS

## 2018-05-16 NOTE — Telephone Encounter (Signed)
Patient called requesting an additional ultrasound. She feels as if Noreene Larsson didn't take enough time with the growth ultrasound performed today and did not show the baby's face etc. She would like to see the baby again.Thanks

## 2018-05-17 LAB — VARICELLA ZOSTER ANTIBODY, IGG: VARICELLA: 305 {index} (ref >165)

## 2018-05-17 LAB — MONITOR DRUG PROFILE 14(MW)
AMPHETAMINE SCREEN URINE: NEGATIVE ng/mL
BARBITURATE SCREEN URINE: NEGATIVE ng/mL
BENZODIAZEPINE SCREEN, URINE: NEGATIVE ng/mL
BUPRENORPHINE, URINE: NEGATIVE ng/mL
CANNABINOIDS UR QL SCN: NEGATIVE ng/mL
CREATININE(CRT), U: 142.6 mg/dL (ref 20.0–300.0)
Cocaine (Metab) Scrn, Ur: NEGATIVE ng/mL
Fentanyl, Urine: NEGATIVE pg/mL
METHADONE SCREEN, URINE: NEGATIVE ng/mL
Meperidine Screen, Urine: NEGATIVE ng/mL
OXYCODONE+OXYMORPHONE UR QL SCN: NEGATIVE ng/mL
Opiate Scrn, Ur: NEGATIVE ng/mL
PH UR, DRUG SCRN: 6.4 (ref 4.5–8.9)
PHENCYCLIDINE QUANTITATIVE URINE: NEGATIVE ng/mL
PROPOXYPHENE SCREEN URINE: NEGATIVE ng/mL
SPECIFIC GRAVITY: 1.025
Tramadol Screen, Urine: NEGATIVE ng/mL

## 2018-05-17 LAB — CBC WITH DIFFERENTIAL
BASOS ABS: 0 10*3/uL (ref 0.0–0.2)
Basos: 0 %
EOS (ABSOLUTE): 0.1 10*3/uL (ref 0.0–0.4)
Eos: 1 %
HEMOGLOBIN: 11.7 g/dL (ref 11.1–15.9)
Hematocrit: 35.4 % (ref 34.0–46.6)
IMMATURE GRANS (ABS): 0 10*3/uL (ref 0.0–0.1)
IMMATURE GRANULOCYTES: 0 %
LYMPHS: 24 %
Lymphocytes Absolute: 2.3 10*3/uL (ref 0.7–3.1)
MCH: 27.2 pg (ref 26.6–33.0)
MCHC: 33.1 g/dL (ref 31.5–35.7)
MCV: 82 fL (ref 79–97)
Monocytes Absolute: 0.6 10*3/uL (ref 0.1–0.9)
Monocytes: 6 %
Neutrophils Absolute: 6.7 10*3/uL (ref 1.4–7.0)
Neutrophils: 69 %
RBC: 4.3 x10E6/uL (ref 3.77–5.28)
RDW: 14.3 % (ref 12.3–15.4)
WBC: 9.7 10*3/uL (ref 3.4–10.8)

## 2018-05-17 LAB — NICOTINE SCREEN, URINE: COTININE UR QL SCN: NEGATIVE ng/mL

## 2018-05-17 LAB — MICROSCOPIC EXAMINATION
CASTS: NONE SEEN /LPF
Epithelial Cells (non renal): >10 /hpf — AB (ref 0–10)

## 2018-05-17 LAB — ANTIBODY SCREEN: Antibody Screen: NEGATIVE

## 2018-05-17 LAB — URINALYSIS, ROUTINE W REFLEX MICROSCOPIC
Bilirubin, UA: NEGATIVE
Glucose, UA: NEGATIVE
NITRITE UA: NEGATIVE
PH UA: 6 (ref 5.0–7.5)
PROTEIN UA: NEGATIVE
RBC, UA: NEGATIVE
Specific Gravity, UA: 1.024 (ref 1.005–1.030)
UUROB: 0.2 mg/dL (ref 0.2–1.0)

## 2018-05-17 LAB — ABO AND RH: Rh Factor: POSITIVE

## 2018-05-17 LAB — TSH: TSH: 1.59 u[IU]/mL (ref 0.450–4.500)

## 2018-05-17 LAB — HEPATITIS B SURFACE ANTIGEN: Hepatitis B Surface Ag: NEGATIVE

## 2018-05-17 LAB — GC/CHLAMYDIA PROBE AMP
CHLAMYDIA, DNA PROBE: NEGATIVE
Neisseria gonorrhoeae by PCR: NEGATIVE

## 2018-05-17 LAB — HIV ANTIBODY (ROUTINE TESTING W REFLEX): HIV Screen 4th Generation wRfx: NONREACTIVE

## 2018-05-17 LAB — RPR: RPR: NONREACTIVE

## 2018-05-17 LAB — RUBELLA SCREEN: Rubella Antibodies, IGG: 1.99 index (ref >0.99)

## 2018-05-29 ENCOUNTER — Encounter: Payer: Commercial Managed Care - PPO | Admitting: Obstetrics and Gynecology

## 2018-06-01 ENCOUNTER — Encounter: Payer: Commercial Managed Care - PPO | Admitting: Obstetrics and Gynecology

## 2018-06-10 ENCOUNTER — Observation Stay
Admission: EM | Admit: 2018-06-10 | Discharge: 2018-06-11 | Disposition: A | Discharge status: Home / Self Care | Payer: Commercial Managed Care - PPO | Attending: Certified Nurse Midwife | Admitting: Certified Nurse Midwife

## 2018-06-10 ENCOUNTER — Other Ambulatory Visit: Payer: Self-pay

## 2018-06-10 DIAGNOSIS — O99213 Obesity complicating pregnancy, third trimester: Secondary | ICD-10-CM | POA: Insufficient documentation

## 2018-06-10 DIAGNOSIS — O26893 Other specified pregnancy related conditions, third trimester: Secondary | ICD-10-CM | POA: Insufficient documentation

## 2018-06-10 DIAGNOSIS — E669 Obesity, unspecified: Secondary | ICD-10-CM | POA: Diagnosis not present

## 2018-06-10 DIAGNOSIS — O368130 Decreased fetal movements, third trimester, not applicable or unspecified: Secondary | ICD-10-CM

## 2018-06-10 DIAGNOSIS — O24419 Gestational diabetes mellitus in pregnancy, unspecified control: Secondary | ICD-10-CM | POA: Diagnosis not present

## 2018-06-10 DIAGNOSIS — O99210 Obesity complicating pregnancy, unspecified trimester: Secondary | ICD-10-CM

## 2018-06-10 DIAGNOSIS — O24410 Gestational diabetes mellitus in pregnancy, diet controlled: Secondary | ICD-10-CM

## 2018-06-10 DIAGNOSIS — O1213 Gestational proteinuria, third trimester: Secondary | ICD-10-CM | POA: Diagnosis not present

## 2018-06-10 DIAGNOSIS — Z833 Family history of diabetes mellitus: Secondary | ICD-10-CM

## 2018-06-10 DIAGNOSIS — Z3A32 32 weeks gestation of pregnancy: Secondary | ICD-10-CM | POA: Insufficient documentation

## 2018-06-10 LAB — COMPREHENSIVE METABOLIC PANEL
ALBUMIN: 2.8 g/dL — AB (ref 3.5–5.0)
ALT: 15 U/L (ref 0–44)
AST: 25 U/L (ref 15–41)
Alkaline Phosphatase: 122 U/L (ref 38–126)
Anion gap: 8 (ref 5–15)
BUN: 8 mg/dL (ref 6–20)
CHLORIDE: 107 mmol/L (ref 98–111)
CO2: 21 mmol/L — AB (ref 22–32)
CREATININE: 0.51 mg/dL (ref 0.44–1.00)
Calcium: 8.9 mg/dL (ref 8.9–10.3)
GFR calc Af Amer: >60 mL/min (ref >60)
Glucose, Bld: 92 mg/dL (ref 70–99)
Potassium: 4.1 mmol/L (ref 3.5–5.1)
SODIUM: 136 mmol/L (ref 135–145)
Total Bilirubin: 0.2 mg/dL — ABNORMAL LOW (ref 0.3–1.2)
Total Protein: 6.2 g/dL — ABNORMAL LOW (ref 6.5–8.1)

## 2018-06-10 LAB — CBC
HCT: 37 % (ref 36.0–46.0)
Hemoglobin: 12.2 g/dL (ref 12.0–15.0)
MCH: 27.4 pg (ref 26.0–34.0)
MCHC: 33 g/dL (ref 30.0–36.0)
MCV: 83.1 fL (ref 80.0–100.0)
NRBC: 0 % (ref 0.0–0.2)
PLATELETS: 216 10*3/uL (ref 150–400)
RBC: 4.45 MIL/uL (ref 3.87–5.11)
RDW: 14.8 % (ref 11.5–15.5)
WBC: 9.2 10*3/uL (ref 4.0–10.5)

## 2018-06-10 LAB — PROTEIN / CREATININE RATIO, URINE
CREATININE, URINE: 96 mg/dL
Protein Creatinine Ratio: 2.5 mg/mg{Cre} — ABNORMAL HIGH (ref 0.00–0.15)
Total Protein, Urine: 240 mg/dL

## 2018-06-10 LAB — GLUCOSE, CAPILLARY: Glucose-Capillary: 127 mg/dL — ABNORMAL HIGH (ref 70–99)

## 2018-06-10 MED ORDER — NIFEDIPINE 10 MG PO CAPS: 20.0000 mg | ORAL_CAPSULE | ORAL | Status: DC | PRN

## 2018-06-10 MED ORDER — TRIAMCINOLONE ACETONIDE 0.1 % EX OINT
TOPICAL_OINTMENT | Freq: Three times a day (TID) | CUTANEOUS | Status: DC
  Administered 2018-06-10 – 2018-06-11 (×3): via TOPICAL
  Filled 2018-06-10: qty 15

## 2018-06-10 MED ORDER — DOCUSATE SODIUM 100 MG PO CAPS
100.0000 mg | ORAL_CAPSULE | Freq: Every day | ORAL | Status: DC
  Filled 2018-06-10: qty 1

## 2018-06-10 MED ORDER — NIFEDIPINE 10 MG PO CAPS: 10.0000 mg | ORAL_CAPSULE | ORAL | Status: DC | PRN

## 2018-06-10 MED ORDER — HYDROXYZINE HCL 25 MG PO TABS
25.0000 mg | ORAL_TABLET | Freq: Three times a day (TID) | ORAL | Status: DC | PRN
  Administered 2018-06-10: 25 mg via ORAL
  Filled 2018-06-10 (×2): qty 1

## 2018-06-10 MED ORDER — PRENATAL MULTIVITAMIN CH
1.0000 | ORAL_TABLET | Freq: Every day | ORAL | Status: DC
  Administered 2018-06-11: 1 via ORAL
  Filled 2018-06-10: qty 1

## 2018-06-10 MED ORDER — BETAMETHASONE SOD PHOS & ACET 6 (3-3) MG/ML IJ SUSP
12.0000 mg | INTRAMUSCULAR | Status: DC
  Administered 2018-06-10: 12 mg via INTRAMUSCULAR
  Filled 2018-06-10: qty 2

## 2018-06-10 MED ORDER — ZOLPIDEM TARTRATE 5 MG PO TABS: 5.0000 mg | ORAL_TABLET | Freq: Every evening | ORAL | Status: DC | PRN

## 2018-06-10 MED ORDER — CALCIUM CARBONATE ANTACID 500 MG PO CHEW: 2.0000 | CHEWABLE_TABLET | ORAL | Status: DC | PRN

## 2018-06-10 MED ORDER — LABETALOL HCL 5 MG/ML IV SOLN
40.0000 mg | INTRAVENOUS | Status: DC | PRN
  Filled 2018-06-10: qty 8

## 2018-06-10 MED ORDER — ACETAMINOPHEN 325 MG PO TABS
650.0000 mg | ORAL_TABLET | ORAL | Status: DC | PRN
  Filled 2018-06-10: qty 2

## 2018-06-10 MED ORDER — BETAMETHASONE SOD PHOS & ACET 6 (3-3) MG/ML IJ SUSP
INTRAMUSCULAR | Status: AC
  Administered 2018-06-10: 12 mg via INTRAMUSCULAR
  Filled 2018-06-10: qty 5

## 2018-06-10 NOTE — OB Triage Note (Addendum)
Patient here for decreased fetal movement. She has not felt baby move other than a few taps sinec 10 am, she has eaten, laid on her side and had some sugar to try and make him move.  Patient has also been having itching over most of her body the last few weeks, she has areas on her abdomen and hands where she has scratched to the point of scabs. She also complains of seeing white spots at times although its not often.

## 2018-06-10 NOTE — H&P (Signed)
Holly Cain is a 22 y.o. G2P0010 at 10w3dhere for 24 hour observation.  Estimated Date of Delivery: 08/02/18.   Length of Stay:  0 Days. Admitted 06/10/2018  Subjective:  Patient presented for evaluation due to decreased fetal movement. Reports intermittent hand, abdominal, and leg itching for the last few days as well as "spots in front of her eyes when showering or moving".   No uterine contractions, no bleeding and no loss of fluid from vagina per patient.   Denies difficulty breathing or respiratory distress, chest pain, headache, blurred vision, epigastric pain,  abdominal pain, dysuria, and leg pain.   Review of systems  ROS negative except as noted above. Information obtained from patient.   Objective:  Pulse Rate:  [79-91] 86 (11/17 1849) BP: (129-161)/(80-109) 154/99 (11/17 1849)  Physical Examination:  CONSTITUTIONAL: Well-developed, well-nourished female in no acute distress.   SKIN: Skin is warm and dry. Not diaphoretic. No erythema. No pallor.   NPassaic Alert and oriented to person, place, and time. Normal reflexes, muscle tone coordination. No cranial nerve deficit noted.  PSYCHIATRIC: Normal mood and affect. Normal behavior. Normal judgment and thought content.  CARDIOVASCULAR: Normal heart rate noted, regular rhythm  RESPIRATORY: Effort and breath sounds normal, no problems with respiration noted  ABDOMEN: Soft, nontender, nondistended, gravid. Scraps present in stretch in marks.   EXTREMITIES: No redness or tenderness in calves or thighs, no limitation in range of motion. 2+ pitting edema present. Negative clonus.   NEUROLOGIC: Alert and oriented, normal speech, no focal findings or movement disorder noted  PELVIC EXAM: Deferred, no complaints.   Fetal monitoring: FHR: 140 bpm, Variability: moderate, Accelerations: Present, Decelerations: Absent   Uterine activity: None   Results for orders placed or performed during the hospital encounter of  06/10/18 (from the past 48 hour(s))  CBC     Status: None   Collection Time: 06/10/18  4:08 PM  Result Value Ref Range   WBC 9.2 4.0 - 10.5 K/uL   RBC 4.45 3.87 - 5.11 MIL/uL   Hemoglobin 12.2 12.0 - 15.0 g/dL   HCT 37.0 36.0 - 46.0 %   MCV 83.1 80.0 - 100.0 fL   MCH 27.4 26.0 - 34.0 pg   MCHC 33.0 30.0 - 36.0 g/dL   RDW 14.8 11.5 - 15.5 %   Platelets 216 150 - 400 K/uL   nRBC 0.0 0.0 - 0.2 %    Comment: [facility]formed at [facility] [address], [place] [id number] Comprehensive metabolic panel     Status: Abnormal   Collection Time: 06/10/18  4:08 PM  Result Value Ref Range   Sodium 136 135 - 145 mmol/L   Potassium 4.1 3.5 - 5.1 mmol/L   Chloride 107 98 - 111 mmol/L   CO2 21 (L) 22 - 32 mmol/L   Glucose, Bld 92 70 - 99 mg/dL   BUN 8 6 - 20 mg/dL   Creatinine, Ser 0.51 0.44 - 1.00 mg/dL   Calcium 8.9 8.9 - 10.3 mg/dL   Total Protein 6.2 (L) 6.5 - 8.1 g/dL   Albumin 2.8 (L) 3.5 - 5.0 g/dL   AST 25 15 - 41 U/L   ALT 15 0 - 44 U/L   Alkaline Phosphatase 122 38 - 126 U/L   Total Bilirubin 0.2 (L) 0.3 - 1.2 mg/dL   GFR calc non Af Amer >60 >60 mL/min   GFR calc Af Amer >60 >60 mL/min    Comment: (NOTE) The eGFR has  been calculated using the CKD EPI equation. This calculation has not been validated in all clinical situations. eGFR's persistently <60 mL/min signify possible Chronic Kidney Disease.    Anion gap 8 5 - 15    Comment: Performed at [facility], [address] [place], [place] [address]  Protein / creatinine ratio, urine     Status: Abnormal   Collection Time: 06/10/18  4:11 PM  Result Value Ref Range   Creatinine, Urine 96 mg/dL   Total Protein, Urine 240 mg/dL    Comment: RESULT CONFIRMED BY MANUAL DILUTION.PMH NO NORMAL RANGE ESTABLISHED FOR THIS TEST    Protein Creatinine Ratio 2.50 (H) 0.00 - 0.15 mg/mg[Cre]    Comment: Performed at [facility], [address] [place], [place] [address]    No results  found.  Current scheduled medications . betamethasone acetate-betamethasone sodium phosphate  12 mg Intramuscular Q24H  . [START ON 06/11/2018] docusate sodium  100 mg Oral Daily  . [START ON 06/11/2018] prenatal multivitamin  1 tablet Oral Q1200  . triamcinolone ointment   Topical TID    I have reviewed the patient's current medications.  ASSESSMENT:  Holly Cain is a 22 year old G2P0010 here for observation due to newly diagnosed pre-eclampsia and itching-r/o ICP, Rh positive, Gestational diabetes-diet controlled, Maternal obesity  Patient Active Problem List   Diagnosis Date Noted  . Indication for care in labor and delivery, antepartum 06/10/2018  . Gestational diabetes 03/20/2018  . Family history of gestational diabetes mellitus (GDM) 03/05/2018  . Obesity in pregnancy 03/05/2018  . Family history of type 2 diabetes mellitus in father 03/05/2018   FHR Category I  PLAN:  Vital signs and lab results reviewed with patient and family; verbalized understanding.   Rx: Betamethasone, vistaril, and triamcinolone, see orders.   Will complete 24-hour urine.  Awaiting results of Bile Acids.   Reviewed red flag symptoms and when to call.   Dr. Marcelline Mates consulted regarding patient status and plan of care.    Diona Fanti, CNM [facility], [facility] 06/10/18 7:14 PM

## 2018-06-11 DIAGNOSIS — O368130 Decreased fetal movements, third trimester, not applicable or unspecified: Secondary | ICD-10-CM

## 2018-06-11 DIAGNOSIS — O24410 Gestational diabetes mellitus in pregnancy, diet controlled: Secondary | ICD-10-CM | POA: Diagnosis not present

## 2018-06-11 DIAGNOSIS — Z3A32 32 weeks gestation of pregnancy: Secondary | ICD-10-CM | POA: Diagnosis not present

## 2018-06-11 DIAGNOSIS — O1403 Mild to moderate pre-eclampsia, third trimester: Secondary | ICD-10-CM

## 2018-06-11 DIAGNOSIS — O1213 Gestational proteinuria, third trimester: Secondary | ICD-10-CM | POA: Diagnosis not present

## 2018-06-11 LAB — CREATININE CLEARANCE, URINE, 24 HOUR
CREATININE 24H UR: 1540 mg/d (ref 600–1800)
Collection Interval-CRCL: 24 hours
Creatinine Clearance: 0 mL/min — ABNORMAL LOW (ref 75–115)
Creatinine, Urine: 220 mg/dL
URINE TOTAL VOLUME-CRCL: 700 mL

## 2018-06-11 LAB — GLUCOSE, CAPILLARY
GLUCOSE-CAPILLARY: 126 mg/dL — AB (ref 70–99)
Glucose-Capillary: 133 mg/dL — ABNORMAL HIGH (ref 70–99)
Glucose-Capillary: 114 mg/dL — ABNORMAL HIGH (ref 70–99)
Glucose-Capillary: 140 mg/dL — ABNORMAL HIGH (ref 70–99)

## 2018-06-11 LAB — PROTEIN, URINE, 24 HOUR
COLLECTION INTERVAL-UPROT: 24 h
Protein, Urine: 323 mg/dL
URINE TOTAL VOLUME-UPROT: 700 mL

## 2018-06-11 MED ORDER — BETAMETHASONE SOD PHOS & ACET 6 (3-3) MG/ML IJ SUSP
12.0000 mg | Freq: Once | INTRAMUSCULAR | Status: AC
  Administered 2018-06-11: 12 mg via INTRAMUSCULAR
  Filled 2018-06-11: qty 2

## 2018-06-11 MED ORDER — ACETAMINOPHEN 500 MG PO TABS: 1000.0000 mg | ORAL_TABLET | Freq: Four times a day (QID) | ORAL | Status: DC | PRN

## 2018-06-11 NOTE — Discharge Instructions (Signed)
Modified bedrest until follow up with [place]wife in [place].    Preeclampsia and Eclampsia Preeclampsia is a serious condition that develops only during pregnancy. It is also called toxemia of pregnancy. This condition causes high blood pressure along with other symptoms, such as swelling and headaches. These symptoms may develop as the condition gets worse. Preeclampsia may occur at 20 weeks of pregnancy or later. Diagnosing and treating preeclampsia early is very important. If not treated early, it can cause serious problems for you and your baby. One problem it can lead to is eclampsia, which is a condition that causes muscle jerking or shaking (convulsions or seizures) in the mother. Delivering your baby is the best treatment for preeclampsia or eclampsia. Preeclampsia and eclampsia symptoms usually go away after your baby is born. What are the causes? The cause of preeclampsia is not known. What increases the risk? The following risk factors make you more likely to develop preeclampsia:  Being pregnant for the first time.  Having had preeclampsia during a past pregnancy.  Having a family history of preeclampsia.  Having high blood pressure.  Being pregnant with twins or triplets.  Being 3835 or older.  Being African-American.  Having kidney disease or diabetes.  Having medical conditions such as lupus or blood diseases.  Being very overweight (obese).  What are the signs or symptoms? The earliest signs of preeclampsia are:  High blood pressure.  Increased protein in your urine. Your health care provider will check for this at every visit before you give birth (prenatal visit).  Other symptoms that may develop as the condition gets worse include:  Severe headaches.  Sudden weight gain.  Swelling of the hands, face, legs, and feet.  Nausea and vomiting.  Vision problems, such as blurred or double vision.  Numbness in the face, arms, legs, and feet.  Urinating less  than usual.  Dizziness.  Slurred speech.  Abdominal pain, especially upper abdominal pain.  Convulsions or seizures.  Symptoms generally go away after giving birth. How is this diagnosed? There are no screening tests for preeclampsia. Your health care provider will ask you about symptoms and check for signs of preeclampsia during your prenatal visits. You may also have tests that include:  Urine tests.  Blood tests.  Checking your blood pressure.  Monitoring your babys heart rate.  Ultrasound.  How is this treated? You and your health care provider will determine the treatment approach that is best for you. Treatment may include:  Having more frequent prenatal exams to check for signs of preeclampsia, if you have an increased risk for preeclampsia.  Bed rest.  Reducing how much salt (sodium) you eat.  Medicine to lower your blood pressure.  Staying in the hospital, if your condition is severe. There, treatment will focus on controlling your blood pressure and the amount of fluids in your body (fluid retention).  You may need to take medicine (magnesium sulfate) to prevent seizures. This medicine may be given as an injection or through an IV tube.  Delivering your baby early, if your condition gets worse. You may have your labor started with medicine (induced), or you may have a cesarean delivery.  Follow these instructions at home: Eating and drinking   Drink enough fluid to keep your urine clear or pale yellow.  Eat a healthy diet that is low in sodium. Do not add salt to your food. Check nutrition labels to see how much sodium a food or beverage contains.  Avoid caffeine. Lifestyle  Do  not use any products that contain nicotine or tobacco, such as cigarettes and e-cigarettes. If you need help quitting, ask your health care provider.  Do not use alcohol or drugs.  Avoid stress as much as possible. Rest and get plenty of sleep. General instructions  Take  over-the-counter and prescription medicines only as told by your health care provider.  When lying down, lie on your side. This keeps pressure off of your baby.  When sitting or lying down, raise (elevate) your feet. Try putting some pillows underneath your lower legs.  Exercise regularly. Ask your health care provider what kinds of exercise are best for you.  Keep all follow-up and prenatal visits as told by your health care provider. This is important. How is this prevented? To prevent preeclampsia or eclampsia from developing during another pregnancy:  Get proper medical care during pregnancy. Your health care provider may be able to prevent preeclampsia or diagnose and treat it early.  Your health care provider may have you take a low-dose aspirin or a calcium supplement during your next pregnancy.  You may have tests of your blood pressure and kidney function after giving birth.  Maintain a healthy weight. Ask your health care provider for help managing weight gain during pregnancy.  Work with your health care provider to manage any long-term (chronic) health conditions you have, such as diabetes or kidney problems.  Contact a health care provider if:  You gain more weight than expected.  You have headaches.  You have nausea or vomiting.  You have abdominal pain.  You feel dizzy or light-headed. Get help right away if:  You develop sudden or severe swelling anywhere in your body. This usually happens in the legs.  You gain 5 lbs (2.3 kg) or more during one week.  You have severe: ? Abdominal pain. ? Headaches. ? Dizziness. ? Vision problems. ? Confusion. ? Nausea or vomiting.  You have a seizure.  You have trouble moving any part of your body.  You develop numbness in any part of your body.  You have trouble speaking.  You have any abnormal bleeding.  You pass out. This information is not intended to replace advice given to you by your health care  provider. Make sure you discuss any questions you have with your health care provider. Document Released: 07/08/2000 Document Revised: 03/08/2016 Document Reviewed: 02/15/2016 Elsevier Interactive Patient Education  [employer].

## 2018-06-11 NOTE — Progress Notes (Signed)
All discharge instructions went over with patient including f/[place] with [place] OB in [place] at her already scheduled appointment on Wednesday. She is to be out of work until then on limited bedrest per [employer]r. She states that she wrote a note for this but RN unable to print and Melody asked that patient call [facility] in [place] with her work fax number and they will fax her work the note. RN discussed reasons to f/u sooner than Wednesday including s/sx of preeclampsia and she voiced understanding of all instructions given. Pt didn't have an iv or any meds that were called in. RN gave family the number to hospital and extension number to press for medical records as they plan to have their OB GYN [facility]. Also patient does have a my chart already and Melody states that she will put all her upcoming lab results from the next couple of days in there for the patient. Patient's bp wnl and only mild headache when left and melody aware of this. Patient discharged home with her mom escorted out by RN in wheelchair.

## 2018-06-11 NOTE — Progress Notes (Signed)
Holly Cain is a 22 y.o. G2P0010 at 32w4dwho is admitted for decreased fetal movement and elevated blood pressures, for observation and steroid treatment/24 horu urine collection.  Estimated Date of Delivery: 08/02/18 Fetal presentation is unsure.  Length of Stay:  0 Days. Admitted 06/10/2018  Subjective: Reports mild headache- not new, wanted to eat breakfast before taking tylenol Patient reports good fetal movement since arriving at the hospital.  She reports no uterine contractions, no bleeding and no loss of fluid per vagina.  Vitals:  Blood pressure (!) 140/91, pulse 94, temperature 98.1 F (36.7 C), temperature source Oral, resp. rate 16, weight 83.5 kg, SpO2 98 %. Physical Examination: CONSTITUTIONAL: Well-developed, well-nourished female in no acute distress.  SKIN: Skin is warm and dry. No rash noted. Not diaphoretic. No erythema. No pallor. NSan Fernando Alert and oriented to person, place, and time. Normal reflexes, muscle tone coordination. No cranial nerve deficit noted. PSYCHIATRIC: Normal mood and affect. Normal behavior. Normal judgment and thought content. CARDIOVASCULAR: Normal heart rate noted, regular rhythm RESPIRATORY: Effort and breath sounds normal, no problems with respiration noted MUSCULOSKELETAL: Normal range of motion. 1+ pitting edema and no tenderness. 2+ distal pulses. ABDOMEN: Soft, nontender, nondistended, gravid. CERVIX:  not examined  Fetal monitoring: FHR: 148 bpm, Variability: moderate, Accelerations: Present, Decelerations: Absent  Uterine activity: no contractions per hour  Results for orders placed or performed during the hospital encounter of 06/10/18 (from the past 48 hour(s))  CBC     Status: None   Collection Time: 06/10/18  4:08 PM  Result Value Ref Range   WBC 9.2 4.0 - 10.5 K/uL   RBC 4.45 3.87 - 5.11 MIL/uL   Hemoglobin 12.2 12.0 - 15.0 g/dL   HCT 37.0 36.0 - 46.0 %   MCV 83.1 80.0 - 100.0 fL   MCH 27.4 26.0 - 34.0 pg   MCHC 33.0  30.0 - 36.0 g/dL   RDW 14.8 11.5 - 15.5 %   Platelets 216 150 - 400 K/uL   nRBC 0.0 0.0 - 0.2 %    Comment: Performed at [facility] [address], [place] [address] Comprehensive metabolic panel     Status: Abnormal   Collection Time: 06/10/18  4:08 PM  Result Value Ref Range   Sodium 136 135 - 145 mmol/L   Potassium 4.1 3.5 - 5.1 mmol/L   Chloride 107 98 - 111 mmol/L   CO2 21 (L) 22 - 32 mmol/L   Glucose, Bld 92 70 - 99 mg/dL   BUN 8 6 - 20 mg/dL   Creatinine, Ser 0.51 0.44 - 1.00 mg/dL   Calcium 8.9 8.9 - 10.3 mg/dL   Total Protein 6.2 (L) 6.5 - 8.1 g/dL   Albumin 2.8 (L) 3.5 - 5.0 g/dL   AST 25 15 - 41 U/L   ALT 15 0 - 44 U/L   Alkaline Phosphatase 122 38 - 126 U/L   Total Bilirubin 0.2 (L) 0.3 - 1.2 mg/dL   GFR calc non Af Amer >60 >60 mL/min   GFR calc Af Amer >60 >60 mL/min    Comment: (NOTE) The eGFR has been calculated using the CKD EPI equation. This calculation has not been validated in all clinical situations. eGFR's persistently <60 mL/min signify possible Chronic Kidney Disease.    Anion gap 8 5 - 15    Comment: Performed at [facility] [address] [address] Protein / creatinine ratio, urine     Status: Abnormal  Collection Time: 06/10/18  4:11 PM  Result Value Ref Range   Creatinine, Urine 96 mg/dL   Total Protein, Urine 240 mg/dL    Comment: RESULT CONFIRMED BY MANUAL DILUTION.PMH NO NORMAL RANGE ESTABLISHED FOR THIS TEST    Protein Creatinine Ratio 2.50 (H) 0.00 - 0.15 mg/mg[Cre]    Comment: Performed at [facility], [address] [place],  [address]  Glucose, capillary     Status: Abnormal   Collection Time: 06/10/18 10:25 PM  Result Value Ref Range   Glucose-Capillary 127 (H) 70 - 99 mg/dL  Glucose, capillary     Status: Abnormal   Collection Time: 06/11/18  8:14 AM  Result Value Ref Range   Glucose-Capillary 133 (H) 70 - 99 mg/dL  Glucose, capillary     Status: Abnormal    Collection Time: 06/11/18 10:33 AM  Result Value Ref Range   Glucose-Capillary 126 (H) 70 - 99 mg/dL    No results found.  Current scheduled medications . betamethasone acetate-betamethasone sodium phosphate  12 mg Intramuscular Q24H  . docusate sodium  100 mg Oral Daily  . prenatal multivitamin  1 tablet Oral Q1200  . triamcinolone ointment   Topical TID    I have reviewed the patient's current medications.  ASSESSMENT: Patient Active Problem List   Diagnosis Date Noted  . Indication for care in labor and delivery, antepartum 06/10/2018  . Gestational diabetes 03/20/2018  . Family history of gestational diabetes mellitus (GDM) 03/05/2018  . Obesity in pregnancy 03/05/2018  . Family history of type 2 diabetes mellitus in father 03/05/2018    PLAN: Will transfer to MB unit to continue BP observation and 24h urine collection. Will d/c home on modified bedrest this pm if stable and to follow up with ob in [place] at scheduled appointment on Wed.  Continue routine antenatal   MELODY N SHAMBLEY, CNM [facility]

## 2018-06-11 NOTE — Progress Notes (Signed)
RN verified with Holly Cain that patient could go home after betamethasone shot tonight at 2000 and would not need to stay until her 24 urine results and other labs were back. Holly said that she will put these results in the patient's my chart and RN verified that she does have one.

## 2018-06-11 NOTE — Discharge Summary (Signed)
Antenatal Physician Discharge Summary  Patient ID: Holly DoeHallie M Cain MRN: [id number] DOB/AGE: 23/09/1995 23 y.o.  Admit date: 06/10/2018 Discharge date: 06/11/2018  Admission Diagnoses:decreased fetal movement; mild pre-eclampsia  Discharge Diagnoses: mild pre-eclampsia with Proteinuria at [redacted] weeks gestation  Prenatal Procedures: NST, Preeclampsia and GDM  Consults: Neonatology, Maternal Fetal Medicine  Significant Diagnostic Studies:  Results for orders placed or performed during the hospital encounter of 06/10/18 (from the past 168 hour(s))  CBC   Collection Time: 06/10/18  4:08 PM  Result Value Ref Range   WBC 9.2 4.0 - 10.5 K/uL   RBC 4.45 3.87 - 5.11 MIL/uL   Hemoglobin 12.2 12.0 - 15.0 g/dL   HCT 40.937.0 81.136.0 - 91.446.0 %   MCV 83.1 80.0 - 100.0 fL   MCH 27.4 26.0 - 34.0 pg   MCHC 33.0 30.0 - 36.0 g/dL   RDW 78.214.8 95.611.5 - 21.315.5 %   Platelets 216 150 - 400 K/uL   nRBC 0.0 0.0 - 0.2 %  Comprehensive metabolic panel   Collection Time: 06/10/18  4:08 PM  Result Value Ref Range   Sodium 136 135 - 145 mmol/L   Potassium 4.1 3.5 - 5.1 mmol/L   Chloride 107 98 - 111 mmol/L   CO2 21 (L) 22 - 32 mmol/L   Glucose, Bld 92 70 - 99 mg/dL   BUN 8 6 - 20 mg/dL   Creatinine, Ser 0.860.51 0.44 - 1.00 mg/dL   Calcium 8.9 8.9 - 57.810.3 mg/dL   Total Protein 6.2 (L) 6.5 - 8.1 g/dL   Albumin 2.8 (L) 3.5 - 5.0 g/dL   AST 25 15 - 41 U/L   ALT 15 0 - 44 U/L   Alkaline Phosphatase 122 38 - 126 U/L   Total Bilirubin 0.2 (L) 0.3 - 1.2 mg/dL   GFR calc non Af Amer >60 >60 mL/min   GFR calc Af Amer >60 >60 mL/min   Anion gap 8 5 - 15  Protein / creatinine ratio, urine   Collection Time: 06/10/18  4:11 PM  Result Value Ref Range   Creatinine, Urine 96 mg/dL   Total Protein, Urine 240 mg/dL   Protein Creatinine Ratio 2.50 (H) 0.00 - 0.15 mg/mg[Cre]  Protein, urine, 24 hour   Collection Time: 06/10/18  7:06 PM  Result Value Ref Range   Urine Total Volume-UPROT 700 mL   Collection Interval-UPROT 24  hours   Protein, Urine 323 mg/dL   Protein, 46N24H Urine NOT CALCULATED 50 - 100 mg/day  Creatinine clearance, urine, 24 hour   Collection Time: 06/10/18  7:06 PM  Result Value Ref Range   Urine Total Volume-CRCL 700 mL   Collection Interval-CRCL 24 hours   Creatinine, Urine 220 mg/dL   Creatinine, 62X24H Ur 5,2841,540 600 - 1,800 mg/day   Creatinine Clearance 0 (L) 75 - 115 mL/min  Glucose, capillary   Collection Time: 06/10/18 10:25 PM  Result Value Ref Range   Glucose-Capillary 127 (H) 70 - 99 mg/dL  Glucose, capillary   Collection Time: 06/11/18  8:14 AM  Result Value Ref Range   Glucose-Capillary 133 (H) 70 - 99 mg/dL  Glucose, capillary   Collection Time: 06/11/18 10:33 AM  Result Value Ref Range   Glucose-Capillary 126 (H) 70 - 99 mg/dL  Glucose, capillary   Collection Time: 06/11/18  2:20 PM  Result Value Ref Range   Glucose-Capillary 114 (H) 70 - 99 mg/dL  Glucose, capillary   Collection Time: 06/11/18  4:22 PM  Result Value Ref Range  Glucose-Capillary 140 (H) 70 - 99 mg/dL   Comment 1 Notify RN     Treatments: analgesia: acetaminophen and procedures: NST  Hospital Course:  This is a 22 y.o. G2P0010 with IUP at [redacted]w[redacted]d admitted for observation due to elevated blood pressures and proteinuria. She was admitted with contractions, noted to have a cervical exam of c/thick/OOP.  No leaking of fluid and no bleeding.  She was initially started procardia PO, also received betamethasone x 2 doses.She was seen by Neonatology during her stay.  She was observed, fetal heart rate monitoring remained reassuring, and she had no signs/symptoms of progressing pre-eclampsia. Stay was extended to received betamethasone and BP monitoring, along with 24 hour urine collection.   She was deemed stable for discharge to home with outpatient follow up with her primary midwife at scheduled appointment in [place] on modified bedrest.  Discharge Exam: BP (!) 137/95 (BP Location: Right Arm) Comment: nurse  notified  Pulse 87   Temp 98 F (36.7 C) (Oral)   Resp 18   Wt 83.5 kg   SpO2 100%   BMI 34.77 kg/m    Discharge Condition: Stable  Disposition: Discharge disposition: 01-Home or Self Care           Holly Cain,CNM [facility]

## 2018-06-12 ENCOUNTER — Encounter: Payer: Self-pay | Admitting: Certified Nurse Midwife

## 2018-06-12 ENCOUNTER — Telehealth: Payer: Self-pay | Admitting: Obstetrics and Gynecology

## 2018-06-12 LAB — BILE ACIDS, TOTAL: Bile Acids Total: 4.5 umol/L (ref 0.0–10.0)

## 2018-06-12 NOTE — Telephone Encounter (Signed)
The patient called and asked if she could get the letter about her needing to be on bedrest for last Monday thru Wednesday and then Sunday thru today, 06/12/2018.  She states her work does not have a fax number, but if the nurse could call when the paperwork is ready, she can provide a fax number for her mom's work.  Her best call back number is [phone], please advise, thanks.

## 2018-07-12 NOTE — Telephone Encounter (Signed)
[place]

## 2018-10-25 ENCOUNTER — Encounter (HOSPITAL_COMMUNITY): Payer: Self-pay
